# Patient Record
Sex: Female | Born: 1950 | Race: White | Hispanic: No | Smoking: Never smoker
Health system: Southern US, Community
[De-identification: ages and names within clinical notes are randomized; demographics above are authoritative.]

## PROBLEM LIST (undated history)

## (undated) DIAGNOSIS — J45909 Unspecified asthma, uncomplicated: Secondary | ICD-10-CM

## (undated) DIAGNOSIS — E785 Hyperlipidemia, unspecified: Secondary | ICD-10-CM

## (undated) DIAGNOSIS — Z889 Allergy status to unspecified drugs, medicaments and biological substances status: Secondary | ICD-10-CM

## (undated) DIAGNOSIS — C801 Malignant (primary) neoplasm, unspecified: Secondary | ICD-10-CM

## (undated) DIAGNOSIS — M810 Age-related osteoporosis without current pathological fracture: Secondary | ICD-10-CM

## (undated) DIAGNOSIS — M1990 Unspecified osteoarthritis, unspecified site: Secondary | ICD-10-CM

## (undated) HISTORY — PX: TONSILLECTOMY: SUR1361

## (undated) HISTORY — PX: OTHER SURGICAL HISTORY: SHX169

## (undated) HISTORY — PX: ABDOMINAL HYSTERECTOMY: SHX81

## (undated) HISTORY — PX: BREAST EXCISIONAL BIOPSY: SUR124

## (undated) HISTORY — PX: TUBAL LIGATION: SHX77

---

## 1984-03-30 HISTORY — PX: REDUCTION MAMMAPLASTY: SUR839

## 2004-04-02 ENCOUNTER — Ambulatory Visit: Payer: Self-pay

## 2004-04-14 ENCOUNTER — Ambulatory Visit: Payer: Self-pay

## 2005-10-20 ENCOUNTER — Ambulatory Visit: Payer: Self-pay | Admitting: Obstetrics and Gynecology

## 2006-10-26 ENCOUNTER — Ambulatory Visit: Payer: Self-pay | Admitting: Obstetrics and Gynecology

## 2006-12-27 ENCOUNTER — Ambulatory Visit: Payer: Self-pay | Admitting: Gastroenterology

## 2007-11-03 ENCOUNTER — Ambulatory Visit: Payer: Self-pay | Admitting: Obstetrics and Gynecology

## 2008-11-15 ENCOUNTER — Ambulatory Visit: Payer: Self-pay | Admitting: Obstetrics and Gynecology

## 2009-12-17 ENCOUNTER — Ambulatory Visit: Payer: Self-pay | Admitting: Obstetrics and Gynecology

## 2011-03-03 ENCOUNTER — Ambulatory Visit: Payer: Self-pay | Admitting: Obstetrics and Gynecology

## 2011-03-31 HISTORY — PX: BREAST BIOPSY: SHX20

## 2011-07-28 ENCOUNTER — Ambulatory Visit: Payer: Self-pay | Admitting: Obstetrics and Gynecology

## 2012-02-02 ENCOUNTER — Ambulatory Visit: Payer: Self-pay | Admitting: Obstetrics and Gynecology

## 2012-03-10 ENCOUNTER — Ambulatory Visit: Payer: Self-pay | Admitting: Surgery

## 2012-03-10 LAB — CBC WITH DIFFERENTIAL/PLATELET
Eosinophil %: 1.7 %
HGB: 14.6 g/dL (ref 12.0–16.0)
Lymphocyte #: 2 10*3/uL (ref 1.0–3.6)
Lymphocyte %: 30.7 %
MCH: 30.8 pg (ref 26.0–34.0)
MCHC: 34.3 g/dL (ref 32.0–36.0)
MCV: 90 fL (ref 80–100)
Neutrophil %: 58.7 %
Platelet: 238 10*3/uL (ref 150–440)
RDW: 12.6 % (ref 11.5–14.5)

## 2012-03-10 LAB — BASIC METABOLIC PANEL
Anion Gap: 6 — ABNORMAL LOW (ref 7–16)
BUN: 12 mg/dL (ref 7–18)
Calcium, Total: 9.1 mg/dL (ref 8.5–10.1)
Chloride: 106 mmol/L (ref 98–107)
Co2: 29 mmol/L (ref 21–32)
Creatinine: 0.84 mg/dL (ref 0.60–1.30)
EGFR (African American): >60
Osmolality: 281 (ref 275–301)

## 2012-03-15 ENCOUNTER — Ambulatory Visit: Payer: Self-pay | Admitting: Surgery

## 2012-03-16 LAB — PATHOLOGY REPORT

## 2012-06-30 ENCOUNTER — Ambulatory Visit: Payer: Self-pay | Admitting: Surgery

## 2014-06-05 ENCOUNTER — Ambulatory Visit: Payer: Self-pay | Admitting: Obstetrics and Gynecology

## 2014-07-17 NOTE — H&P (Signed)
PATIENT NAME:  Susan Nicholson, Susan Nicholson MR#:  [id number] DATE OF BIRTH:  Oct 15, 1950  DATE OF ADMISSION:  03/15/2012  CHIEF COMPLAINT: Right mammographic abnormality.   HISTORY OF PRESENT ILLNESS: This is a patient who has had multiple aspirations of a breast cyst of the right breast, which is persistent and recurrent. She is here for elective right needle localization breast biopsy. A needle localization wire will be placed in the right breast using ultrasound guidance in the office prior to this procedure in the operating room.   PAST MEDICAL HISTORY: Hyperlipidemia, asthma, history of melanoma, stress incontinence and colon polyps.   MEDICATIONS: Estrogens, Premarin, Fosamax and gemfibrozil.   FAMILY HISTORY: Noncontributory.   SOCIAL HISTORY: The patient does not smoke or drink.   REVIEW OF SYSTEMS: A 10 system review has been performed and documented in the office chart.   PHYSICAL EXAMINATION:  GENERAL: Healthy female patient.  NECK: No palpable neck nodes.  CHEST: Clear to auscultation.  CARDIAC: Regular rate and rhythm.  ABDOMEN: Soft, nontender.  EXTREMITIES: Without edema. Calves are nontender.  NEUROLOGIC: Grossly intact.  INTEGUMENT: No jaundice.  BREASTS: No mass in the left breast. In the right breast, no palpable mass (mammographic and ultrasonographic abnormality is in the medial portion at the 3 o'clock position).   ASSESSMENT AND PLAN: This is a patient with a mammographic abnormality of the right breast. Multiple cyst aspirations have been performed in this area and there is an area of persistent recurrence requiring excisional biopsy. The rationale for this has been discussed. The options of observation or continued aspiration had been reviewed and the risks of bleeding, infection, hematoma, seroma and cosmetic deformity have been reviewed. She and her husband understood and agreed to proceed.    ____________________________ Jerrol Banana Burt Knack, MD rec:aw D: 03/14/2012  04:54:09 ET T: 03/15/2012 08:15:02 ET JOB#: [id number]  cc: Jerrol Banana. Burt Knack, MD, <Dictator> Florene Glen MD ELECTRONICALLY SIGNED 03/15/2012 12:28

## 2014-07-17 NOTE — Op Note (Signed)
PATIENT NAME:  Susan Nicholson, SHALL MR#:  [id number] DATE OF BIRTH:  09-18-1950  DATE OF PROCEDURE:  03/15/2012  PREOPERATIVE DIAGNOSIS: Right mammographic abnormality.   POSTOPERATIVE DIAGNOSIS: Right mammographic abnormality.   PROCEDURE: Right ultrasound-guided needle localization breast biopsy.   ANESTHESIA: General with LMA.   INDICATIONS: This is a patient with an abnormality on her right mammogram and ultrasound. It shows to be a cyst on ultrasound. Multiple aspirations have been performed and it recurs quickly, necessitating definitive biopsy.   Preoperatively, we discussed the rationale for surgery, the options of observation, risks of bleeding, infection, cosmetic deformity including hematoma, seroma and draining wound and concave wound. She understood and agreed to proceed. This was reviewed with both her and her husband in the preop holding area.   Additionally, a needle localization wire was placed under ultrasound guidance in the office prior to coming to the operating room. See office notes for that dictation.   FINDINGS: Probable fibrocystic disease surrounded by scar from prior breast reduction surgery.   DESCRIPTION OF PROCEDURE: The patient was induced to general anesthesia, prepped and draped in a sterile fashion. A curvilinear periareolar incision was made on the right near the entry point of the needle localization wire. Dissection down to the wire was performed, and then it was traced down to the end of the wire. What appeared to be a fibrocystic complex with 1 dominant cyst was identified, and the specimen was elevated and sent off for examination. Hemostasis was with electrocautery. Marcaine was infiltrated in the skin and subcutaneous tissues around the cavity walls for a total of 30 mL. There were no other suspicious lesions palpable or visible on the cavity walls. Once assuring that hemostasis was adequate and sponge, lap and needle count was correct, the wound was closed in  layers with 3-0 Vicryl and 4-0 subcuticular Monocryl. Steri-Strips, Mastisol and sterile dressings were placed.   The patient tolerated the procedure well. There were no complications. She was taken to the recovery room in stable condition, to be discharged in the care of her family. Followup in 10 days.   ____________________________ Jerrol Banana Burt Knack, MD rec:jm D: 03/15/2012 11:40:50 ET T: 03/15/2012 21:15:12 ET JOB#: [id number]  cc: Jerrol Banana. Burt Knack, MD, <Dictator> Florene Glen MD ELECTRONICALLY SIGNED 03/16/2012 16:10

## 2015-06-05 ENCOUNTER — Other Ambulatory Visit: Payer: Self-pay | Admitting: Obstetrics and Gynecology

## 2015-06-05 DIAGNOSIS — Z1231 Encounter for screening mammogram for malignant neoplasm of breast: Secondary | ICD-10-CM

## 2015-06-13 ENCOUNTER — Other Ambulatory Visit: Payer: Self-pay | Admitting: Obstetrics and Gynecology

## 2015-06-13 ENCOUNTER — Ambulatory Visit
Admission: RE | Admit: 2015-06-13 | Discharge: 2015-06-13 | Disposition: A | Discharge status: Home / Self Care | Payer: 59 | Source: Ambulatory Visit | Attending: Obstetrics and Gynecology | Admitting: Obstetrics and Gynecology

## 2015-06-13 DIAGNOSIS — Z1231 Encounter for screening mammogram for malignant neoplasm of breast: Secondary | ICD-10-CM | POA: Diagnosis present

## 2016-06-09 ENCOUNTER — Other Ambulatory Visit: Payer: Self-pay | Admitting: Obstetrics and Gynecology

## 2016-06-09 DIAGNOSIS — Z1231 Encounter for screening mammogram for malignant neoplasm of breast: Secondary | ICD-10-CM

## 2016-07-07 ENCOUNTER — Ambulatory Visit
Admission: RE | Admit: 2016-07-07 | Discharge: 2016-07-07 | Disposition: A | Discharge status: Home / Self Care | Payer: 59 | Source: Ambulatory Visit | Attending: Obstetrics and Gynecology | Admitting: Obstetrics and Gynecology

## 2016-07-07 DIAGNOSIS — Z1231 Encounter for screening mammogram for malignant neoplasm of breast: Secondary | ICD-10-CM | POA: Insufficient documentation

## 2016-10-08 DIAGNOSIS — M65331 Trigger finger, right middle finger: Secondary | ICD-10-CM | POA: Diagnosis not present

## 2016-10-08 DIAGNOSIS — M65351 Trigger finger, right little finger: Secondary | ICD-10-CM | POA: Diagnosis not present

## 2016-10-09 ENCOUNTER — Other Ambulatory Visit: Payer: Self-pay | Admitting: Internal Medicine

## 2016-10-09 DIAGNOSIS — M8589 Other specified disorders of bone density and structure, multiple sites: Secondary | ICD-10-CM | POA: Diagnosis not present

## 2016-10-09 DIAGNOSIS — N393 Stress incontinence (female) (male): Secondary | ICD-10-CM | POA: Diagnosis not present

## 2016-10-09 DIAGNOSIS — Z3201 Encounter for pregnancy test, result positive: Secondary | ICD-10-CM

## 2016-10-09 DIAGNOSIS — Z136 Encounter for screening for cardiovascular disorders: Secondary | ICD-10-CM | POA: Diagnosis not present

## 2016-10-09 DIAGNOSIS — E7800 Pure hypercholesterolemia, unspecified: Secondary | ICD-10-CM | POA: Diagnosis not present

## 2016-10-09 DIAGNOSIS — Z0000 Encounter for general adult medical examination without abnormal findings: Secondary | ICD-10-CM | POA: Diagnosis not present

## 2016-10-09 DIAGNOSIS — Z780 Asymptomatic menopausal state: Secondary | ICD-10-CM | POA: Diagnosis not present

## 2016-10-09 DIAGNOSIS — Z23 Encounter for immunization: Secondary | ICD-10-CM | POA: Diagnosis not present

## 2016-10-15 ENCOUNTER — Ambulatory Visit
Admission: RE | Admit: 2016-10-15 | Discharge: 2016-10-15 | Disposition: A | Discharge status: Home / Self Care | Payer: PPO | Source: Ambulatory Visit | Attending: Internal Medicine | Admitting: Internal Medicine

## 2016-10-15 DIAGNOSIS — E7800 Pure hypercholesterolemia, unspecified: Secondary | ICD-10-CM | POA: Diagnosis not present

## 2016-10-15 DIAGNOSIS — Z136 Encounter for screening for cardiovascular disorders: Secondary | ICD-10-CM | POA: Diagnosis not present

## 2016-10-15 DIAGNOSIS — I714 Abdominal aortic aneurysm, without rupture: Secondary | ICD-10-CM | POA: Diagnosis not present

## 2016-10-15 DIAGNOSIS — Z8249 Family history of ischemic heart disease and other diseases of the circulatory system: Secondary | ICD-10-CM | POA: Diagnosis not present

## 2016-10-15 DIAGNOSIS — Z3201 Encounter for pregnancy test, result positive: Secondary | ICD-10-CM

## 2016-11-09 DIAGNOSIS — M65331 Trigger finger, right middle finger: Secondary | ICD-10-CM | POA: Diagnosis not present

## 2016-11-09 DIAGNOSIS — M65351 Trigger finger, right little finger: Secondary | ICD-10-CM | POA: Diagnosis not present

## 2016-12-18 DIAGNOSIS — L03116 Cellulitis of left lower limb: Secondary | ICD-10-CM | POA: Diagnosis not present

## 2017-01-06 DIAGNOSIS — R399 Unspecified symptoms and signs involving the genitourinary system: Secondary | ICD-10-CM | POA: Diagnosis not present

## 2017-01-06 DIAGNOSIS — N390 Urinary tract infection, site not specified: Secondary | ICD-10-CM | POA: Diagnosis not present

## 2017-05-07 DIAGNOSIS — K219 Gastro-esophageal reflux disease without esophagitis: Secondary | ICD-10-CM | POA: Diagnosis not present

## 2017-05-07 DIAGNOSIS — Z1211 Encounter for screening for malignant neoplasm of colon: Secondary | ICD-10-CM | POA: Diagnosis not present

## 2017-06-14 ENCOUNTER — Other Ambulatory Visit: Payer: Self-pay | Admitting: Internal Medicine

## 2017-06-14 DIAGNOSIS — Z1231 Encounter for screening mammogram for malignant neoplasm of breast: Secondary | ICD-10-CM

## 2017-06-15 DIAGNOSIS — Z1272 Encounter for screening for malignant neoplasm of vagina: Secondary | ICD-10-CM | POA: Diagnosis not present

## 2017-06-15 DIAGNOSIS — Z124 Encounter for screening for malignant neoplasm of cervix: Secondary | ICD-10-CM | POA: Diagnosis not present

## 2017-06-15 DIAGNOSIS — Z01419 Encounter for gynecological examination (general) (routine) without abnormal findings: Secondary | ICD-10-CM | POA: Diagnosis not present

## 2017-07-12 ENCOUNTER — Ambulatory Visit: Admission: RE | Admit: 2017-07-12 | Payer: PPO | Source: Ambulatory Visit | Admitting: Gastroenterology

## 2017-07-12 ENCOUNTER — Encounter: Admission: RE | Payer: Self-pay | Source: Ambulatory Visit

## 2017-07-12 HISTORY — DX: Allergy status to unspecified drugs, medicaments and biological substances: Z88.9

## 2017-07-12 HISTORY — DX: Age-related osteoporosis without current pathological fracture: M81.0

## 2017-07-12 HISTORY — DX: Malignant (primary) neoplasm, unspecified: C80.1

## 2017-07-12 HISTORY — DX: Unspecified asthma, uncomplicated: J45.909

## 2017-07-12 HISTORY — DX: Hyperlipidemia, unspecified: E78.5

## 2017-07-12 HISTORY — DX: Unspecified osteoarthritis, unspecified site: M19.90

## 2017-07-12 SURGERY — COLONOSCOPY WITH PROPOFOL
Anesthesia: General

## 2017-07-14 ENCOUNTER — Ambulatory Visit
Admission: RE | Admit: 2017-07-14 | Discharge: 2017-07-14 | Disposition: A | Discharge status: Home / Self Care | Payer: PPO | Source: Ambulatory Visit | Attending: Internal Medicine | Admitting: Internal Medicine

## 2017-07-14 DIAGNOSIS — Z1231 Encounter for screening mammogram for malignant neoplasm of breast: Secondary | ICD-10-CM

## 2017-09-17 ENCOUNTER — Ambulatory Visit: Payer: PPO | Admitting: Certified Registered Nurse Anesthetist

## 2017-09-17 ENCOUNTER — Encounter
Admission: RE | Disposition: A | Discharge status: Home / Self Care | Payer: Self-pay | Source: Ambulatory Visit | Attending: Gastroenterology

## 2017-09-17 ENCOUNTER — Ambulatory Visit
Admission: RE | Admit: 2017-09-17 | Discharge: 2017-09-17 | Disposition: A | Discharge status: Home / Self Care | Payer: PPO | Source: Ambulatory Visit | Attending: Gastroenterology | Admitting: Gastroenterology

## 2017-09-17 ENCOUNTER — Encounter: Payer: Self-pay | Admitting: *Deleted

## 2017-09-17 DIAGNOSIS — K5790 Diverticulosis of intestine, part unspecified, without perforation or abscess without bleeding: Secondary | ICD-10-CM | POA: Diagnosis not present

## 2017-09-17 DIAGNOSIS — K5730 Diverticulosis of large intestine without perforation or abscess without bleeding: Secondary | ICD-10-CM | POA: Insufficient documentation

## 2017-09-17 DIAGNOSIS — Z85828 Personal history of other malignant neoplasm of skin: Secondary | ICD-10-CM | POA: Diagnosis not present

## 2017-09-17 DIAGNOSIS — J45909 Unspecified asthma, uncomplicated: Secondary | ICD-10-CM | POA: Diagnosis not present

## 2017-09-17 DIAGNOSIS — D123 Benign neoplasm of transverse colon: Secondary | ICD-10-CM | POA: Insufficient documentation

## 2017-09-17 DIAGNOSIS — M1990 Unspecified osteoarthritis, unspecified site: Secondary | ICD-10-CM | POA: Diagnosis not present

## 2017-09-17 DIAGNOSIS — Z1211 Encounter for screening for malignant neoplasm of colon: Secondary | ICD-10-CM | POA: Diagnosis not present

## 2017-09-17 DIAGNOSIS — Z79899 Other long term (current) drug therapy: Secondary | ICD-10-CM | POA: Diagnosis not present

## 2017-09-17 DIAGNOSIS — M810 Age-related osteoporosis without current pathological fracture: Secondary | ICD-10-CM | POA: Insufficient documentation

## 2017-09-17 DIAGNOSIS — Z7982 Long term (current) use of aspirin: Secondary | ICD-10-CM | POA: Diagnosis not present

## 2017-09-17 DIAGNOSIS — Z7951 Long term (current) use of inhaled steroids: Secondary | ICD-10-CM | POA: Insufficient documentation

## 2017-09-17 DIAGNOSIS — E785 Hyperlipidemia, unspecified: Secondary | ICD-10-CM | POA: Diagnosis not present

## 2017-09-17 DIAGNOSIS — K635 Polyp of colon: Secondary | ICD-10-CM | POA: Diagnosis not present

## 2017-09-17 HISTORY — PX: COLONOSCOPY WITH PROPOFOL: SHX5780

## 2017-09-17 SURGERY — COLONOSCOPY WITH PROPOFOL
Anesthesia: General

## 2017-09-17 MED ORDER — SODIUM CHLORIDE 0.9 % IV SOLN: INTRAVENOUS | Status: DC

## 2017-09-17 MED ORDER — PROPOFOL 10 MG/ML IV BOLUS
INTRAVENOUS | Status: DC | PRN
  Administered 2017-09-17: 10 mg via INTRAVENOUS
  Administered 2017-09-17: 60 mg via INTRAVENOUS

## 2017-09-17 MED ORDER — PROPOFOL 500 MG/50ML IV EMUL
INTRAVENOUS | Status: AC
  Filled 2017-09-17: qty 50

## 2017-09-17 MED ORDER — SODIUM CHLORIDE 0.9 % IV SOLN
INTRAVENOUS | Status: DC
  Administered 2017-09-17: 1000 mL via INTRAVENOUS

## 2017-09-17 MED ORDER — PROPOFOL 500 MG/50ML IV EMUL
INTRAVENOUS | Status: DC | PRN
  Administered 2017-09-17: 160 ug/kg/min via INTRAVENOUS

## 2017-09-17 NOTE — H&P (Signed)
Outpatient short stay form Pre-procedure 09/17/2017 10:39 AM Lollie Sails MD  Primary Physician: Glendon Axe, MD  Reason for visit: Colonoscopy  History of present illness: Patient is a 67 year old female presenting today for colonoscopy.  Her last was over 10 years ago with finding of 2 small hyperplastic polyps.  Patient takes a 81 mg aspirin about 3 times a week.  She takes no other aspirin products or blood thinning agents.  She tolerated her prep well.    Current Facility-Administered Medications:  .  0.9 %  sodium chloride infusion, , Intravenous, Continuous, Lollie Sails, MD .  0.9 %  sodium chloride infusion, , Intravenous, Continuous, Lollie Sails, MD, Last Rate: 20 mL/hr at 09/17/17 1019, 1,000 mL at 09/17/17 1019  Medications Prior to Admission  Medication Sig Dispense Refill Last Dose  . conjugated estrogens (PREMARIN) vaginal cream Place 1 Applicatorful vaginally daily.   09/16/2017 at Unknown time  . darifenacin (ENABLEX) 7.5 MG 24 hr tablet Take 7.5 mg by mouth daily.   Past Week at Unknown time  . Multiple Vitamin (MULTIVITAMIN) tablet Take 1 tablet by mouth daily.   Past Week at Unknown time  . raloxifene (EVISTA) 60 MG tablet Take 60 mg by mouth daily.   09/16/2017 at Unknown time  . simvastatin (ZOCOR) 20 MG tablet Take 20 mg by mouth daily.   09/16/2017 at Unknown time  . albuterol (PROVENTIL HFA;VENTOLIN HFA) 108 (90 Base) MCG/ACT inhaler Inhale into the lungs every 6 (six) hours as needed for wheezing or shortness of breath.   Not Taking at Unknown time     Not on File   Past Medical History:  Diagnosis Date  . Arthritis   . Asthma   . Cancer ([place])    basal cell   . History of seasonal allergies   . Hyperlipidemia   . Osteoporosis     Review of systems:      Physical Exam    Heart and lungs: Without rub or gallop, lungs are bilaterally clear.    HEENT: Normocephalic atraumatic eyes are anicteric    Other:    Pertinant exam for  procedure: Soft nontender nondistended bowel sounds positive normoactive    Planned proceedures: Anoscopy and indicated per I have discussed the risks benefits and complications of procedures to include not limited to bleeding, infection, perforation and the risk of sedation and the patient wishes to proceed.    Lollie Sails, MD Gastroenterology 09/17/2017  10:39 AM

## 2017-09-17 NOTE — Anesthesia Postprocedure Evaluation (Signed)
Anesthesia Post Note  Patient: Susan Nicholson  Procedure(s) Performed: COLONOSCOPY WITH PROPOFOL (N/A )  Patient location during evaluation: Endoscopy Anesthesia Type: General Level of consciousness: awake and alert Pain management: pain level controlled Vital Signs Assessment: post-procedure vital signs reviewed and stable Respiratory status: spontaneous breathing, nonlabored ventilation, respiratory function stable and patient connected to nasal cannula oxygen Cardiovascular status: blood pressure returned to baseline and stable Postop Assessment: no apparent nausea or vomiting Anesthetic complications: no     Last Vitals:  Vitals:   09/17/17 1155 09/17/17 1205  BP: (!) 98/59 117/66  Pulse:    Resp:    Temp: (!) 36.1 C   SpO2:      Last Pain:  Vitals:   09/17/17 1215  TempSrc:   PainSc: 0-No pain                 Martha Clan

## 2017-09-17 NOTE — Anesthesia Preprocedure Evaluation (Signed)
Anesthesia Evaluation  Patient identified by MRN, date of birth, ID band Patient awake    Reviewed: Allergy & Precautions, H&P , NPO status , Patient's Chart, lab work & pertinent test results, reviewed documented beta blocker date and time   History of Anesthesia Complications Negative for: history of anesthetic complications  Airway Mallampati: I  TM Distance: >3 FB Neck ROM: full    Dental  (+) Dental Advidsory Given, Caps, Teeth Intact   Pulmonary neg shortness of breath, asthma (many years ago) , neg COPD, neg recent URI,           Cardiovascular Exercise Tolerance: Good negative cardio ROS       Neuro/Psych negative neurological ROS  negative psych ROS   GI/Hepatic negative GI ROS, Neg liver ROS,   Endo/Other  negative endocrine ROS  Renal/GU negative Renal ROS  negative genitourinary   Musculoskeletal   Abdominal   Peds  Hematology negative hematology ROS (+)   Anesthesia Other Findings Past Medical History: No date: Arthritis No date: Asthma No date: Cancer ([place])     Comment:  basal cell  No date: History of seasonal allergies No date: Hyperlipidemia No date: Osteoporosis   Reproductive/Obstetrics negative OB ROS                             Anesthesia Physical Anesthesia Plan  ASA: II  Anesthesia Plan: General   Post-op Pain Management:    Induction: Intravenous  PONV Risk Score and Plan: 3 and Propofol infusion  Airway Management Planned: Nasal Cannula  Additional Equipment:   Intra-op Plan:   Post-operative Plan:   Informed Consent: I have reviewed the patients History and Physical, chart, labs and discussed the procedure including the risks, benefits and alternatives for the proposed anesthesia with the patient or authorized representative who has indicated his/her understanding and acceptance.   Dental Advisory Given  Plan Discussed with:  Anesthesiologist, CRNA and Surgeon  Anesthesia Plan Comments:         Anesthesia Quick Evaluation

## 2017-09-17 NOTE — Anesthesia Post-op Follow-up Note (Signed)
Anesthesia QCDR form completed.        

## 2017-09-17 NOTE — Transfer of Care (Signed)
Immediate Anesthesia Transfer of Care Note  Patient: Susan Nicholson  Procedure(s) Performed: COLONOSCOPY WITH PROPOFOL (N/A )  Patient Location: PACU  Anesthesia Type:General  Level of Consciousness: sedated  Airway & Oxygen Therapy: Patient Spontanous Breathing and Patient connected to nasal cannula oxygen  Post-op Assessment: Report given to RN and Post -op Vital signs reviewed and stable  Post vital signs: Reviewed and stable  Last Vitals:  Vitals Value Taken Time  BP 98/59 09/17/2017 11:56 AM  Temp    Pulse 68 09/17/2017 11:56 AM  Resp 13 09/17/2017 11:56 AM  SpO2 99 % 09/17/2017 11:56 AM  Vitals shown include unvalidated device data.  Last Pain:  Vitals:   09/17/17 0952  TempSrc: Tympanic  PainSc: 0-No pain         Complications: No apparent anesthesia complications

## 2017-09-17 NOTE — Op Note (Signed)
[facility] Gastroenterology Patient Name: Susan Nicholson Procedure Date: 09/17/2017 11:17 AM MRN: [id number] Account #: [id number] Date of Birth: [birth year] Admit Type: Outpatient Age: 67 Room: [facility] ENDO ROOM 3 Gender: Female Note Status: Finalized Procedure:            Colonoscopy Indications:          Screening for colorectal malignant neoplasm Providers:            Lollie Sails, MD Referring MD:         Glendon Axe (Referring MD) Medicines:            Monitored Anesthesia Care Complications:        No immediate complications. Procedure:            Pre-Anesthesia Assessment:                       - ASA Grade Assessment: II - A patient with mild                        systemic disease.                       After obtaining informed consent, the colonoscope was                        passed under direct vision. Throughout the procedure,                        the patient's blood pressure, pulse, and oxygen                        saturations were monitored continuously. The                        Colonoscope was introduced through the anus and                        advanced to the the cecum, identified by appendiceal                        orifice and ileocecal valve. The colonoscopy was                        performed with moderate difficulty due to significant                        looping. Successful completion of the procedure was                        aided by changing the patient to a supine position,                        changing the patient to a prone position and using                        manual pressure. The patient tolerated the procedure                        well. The quality of the bowel preparation was good. Findings:      A 2 mm polyp was found in  the transverse colon. The polyp was sessile.       The polyp was removed with a cold biopsy forceps. Resection and       retrieval were complete.      A few small-mouthed diverticula  were found in the sigmoid colon and       descending colon.      The retroflexed view of the distal rectum and anal verge was normal and       showed no anal or rectal abnormalities.      The digital rectal exam was normal. Impression:           - One 2 mm polyp in the transverse colon, removed with                        a cold biopsy forceps. Resected and retrieved.                       - Diverticulosis in the sigmoid colon and in the                        descending colon.                       - The distal rectum and anal verge are normal on                        retroflexion view. Recommendation:       - Discharge patient to home.                       - Await pathology results.                       - Telephone GI clinic for pathology results in 1 week. Procedure Code(s):    --- Professional ---                       [phone], Colonoscopy, flexible; with biopsy, single or                        multiple Diagnosis Code(s):    --- Professional ---                       Z12.11, Encounter for screening for malignant neoplasm                        of colon                       D12.3, Benign neoplasm of transverse colon (hepatic                        flexure or splenic flexure)                       K57.30, Diverticulosis of large intestine without                        perforation or abscess without bleeding CPT copyright 2017 American Medical Association. All rights reserved. The codes documented in this report are preliminary and upon coder review may  be revised to meet current compliance requirements. [name]  [name], MD 09/17/2017 11:54:51 AM This report has been signed electronically. Number of Addenda: 0 Note Initiated On: 09/17/2017 11:17 AM Scope Withdrawal Time: 0 hours 9 minutes 16 seconds  Total Procedure Duration: 0 hours 29 minutes 54 seconds       [facility]

## 2017-09-17 NOTE — Anesthesia Procedure Notes (Signed)
Performed by: Demetrius Charity, CRNA Pre-anesthesia Checklist: Patient identified, Patient being monitored, Timeout performed, Emergency Drugs available and Suction available Patient Re-evaluated:Patient Re-evaluated prior to induction Oxygen Delivery Method: Nasal cannula Preoxygenation: Pre-oxygenation with 100% oxygen Dental Injury: Teeth and Oropharynx as per pre-operative assessment

## 2017-09-19 NOTE — Progress Notes (Signed)
Non-identifying voicemail.  No message left.

## 2017-09-20 LAB — SURGICAL PATHOLOGY

## 2017-10-29 DIAGNOSIS — Z1329 Encounter for screening for other suspected endocrine disorder: Secondary | ICD-10-CM | POA: Diagnosis not present

## 2017-10-29 DIAGNOSIS — Z131 Encounter for screening for diabetes mellitus: Secondary | ICD-10-CM | POA: Diagnosis not present

## 2017-10-29 DIAGNOSIS — Z13220 Encounter for screening for lipoid disorders: Secondary | ICD-10-CM | POA: Diagnosis not present

## 2017-10-29 DIAGNOSIS — Z0000 Encounter for general adult medical examination without abnormal findings: Secondary | ICD-10-CM | POA: Diagnosis not present

## 2017-10-29 DIAGNOSIS — I10 Essential (primary) hypertension: Secondary | ICD-10-CM | POA: Diagnosis not present

## 2017-11-10 DIAGNOSIS — Z23 Encounter for immunization: Secondary | ICD-10-CM | POA: Diagnosis not present

## 2017-11-10 DIAGNOSIS — Z0000 Encounter for general adult medical examination without abnormal findings: Secondary | ICD-10-CM | POA: Diagnosis not present

## 2017-11-10 DIAGNOSIS — E7800 Pure hypercholesterolemia, unspecified: Secondary | ICD-10-CM | POA: Diagnosis not present

## 2017-11-10 DIAGNOSIS — M8589 Other specified disorders of bone density and structure, multiple sites: Secondary | ICD-10-CM | POA: Diagnosis not present

## 2017-11-10 DIAGNOSIS — I10 Essential (primary) hypertension: Secondary | ICD-10-CM | POA: Diagnosis not present

## 2017-11-12 DIAGNOSIS — I83892 Varicose veins of left lower extremities with other complications: Secondary | ICD-10-CM | POA: Diagnosis not present

## 2017-11-12 DIAGNOSIS — I8311 Varicose veins of right lower extremity with inflammation: Secondary | ICD-10-CM | POA: Diagnosis not present

## 2017-11-12 DIAGNOSIS — I8312 Varicose veins of left lower extremity with inflammation: Secondary | ICD-10-CM | POA: Diagnosis not present

## 2017-11-12 DIAGNOSIS — I83812 Varicose veins of left lower extremities with pain: Secondary | ICD-10-CM | POA: Diagnosis not present

## 2017-12-14 DIAGNOSIS — M8588 Other specified disorders of bone density and structure, other site: Secondary | ICD-10-CM | POA: Diagnosis not present

## 2017-12-24 DIAGNOSIS — I8312 Varicose veins of left lower extremity with inflammation: Secondary | ICD-10-CM | POA: Diagnosis not present

## 2017-12-24 DIAGNOSIS — I83812 Varicose veins of left lower extremities with pain: Secondary | ICD-10-CM | POA: Diagnosis not present

## 2017-12-24 DIAGNOSIS — I83892 Varicose veins of left lower extremities with other complications: Secondary | ICD-10-CM | POA: Diagnosis not present

## 2018-02-02 DIAGNOSIS — I83892 Varicose veins of left lower extremities with other complications: Secondary | ICD-10-CM | POA: Diagnosis not present

## 2018-02-02 DIAGNOSIS — I8312 Varicose veins of left lower extremity with inflammation: Secondary | ICD-10-CM | POA: Diagnosis not present

## 2018-02-04 DIAGNOSIS — I8312 Varicose veins of left lower extremity with inflammation: Secondary | ICD-10-CM | POA: Diagnosis not present

## 2018-02-04 DIAGNOSIS — I83892 Varicose veins of left lower extremities with other complications: Secondary | ICD-10-CM | POA: Diagnosis not present

## 2018-02-04 DIAGNOSIS — I83812 Varicose veins of left lower extremities with pain: Secondary | ICD-10-CM | POA: Diagnosis not present

## 2018-02-17 DIAGNOSIS — I83812 Varicose veins of left lower extremities with pain: Secondary | ICD-10-CM | POA: Diagnosis not present

## 2018-02-17 DIAGNOSIS — I8312 Varicose veins of left lower extremity with inflammation: Secondary | ICD-10-CM | POA: Diagnosis not present

## 2018-03-03 DIAGNOSIS — I8312 Varicose veins of left lower extremity with inflammation: Secondary | ICD-10-CM | POA: Diagnosis not present

## 2018-03-03 DIAGNOSIS — I83812 Varicose veins of left lower extremities with pain: Secondary | ICD-10-CM | POA: Diagnosis not present

## 2018-04-06 DIAGNOSIS — I83812 Varicose veins of left lower extremities with pain: Secondary | ICD-10-CM | POA: Diagnosis not present

## 2018-04-06 DIAGNOSIS — M7981 Nontraumatic hematoma of soft tissue: Secondary | ICD-10-CM | POA: Diagnosis not present

## 2018-04-06 DIAGNOSIS — I8312 Varicose veins of left lower extremity with inflammation: Secondary | ICD-10-CM | POA: Diagnosis not present

## 2018-06-09 ENCOUNTER — Other Ambulatory Visit: Payer: Self-pay | Admitting: Obstetrics and Gynecology

## 2018-06-09 DIAGNOSIS — Z1231 Encounter for screening mammogram for malignant neoplasm of breast: Secondary | ICD-10-CM

## 2018-07-27 ENCOUNTER — Ambulatory Visit: Payer: PPO

## 2018-08-23 DIAGNOSIS — Z124 Encounter for screening for malignant neoplasm of cervix: Secondary | ICD-10-CM | POA: Diagnosis not present

## 2018-08-23 DIAGNOSIS — E7800 Pure hypercholesterolemia, unspecified: Secondary | ICD-10-CM | POA: Diagnosis not present

## 2018-08-23 DIAGNOSIS — Z780 Asymptomatic menopausal state: Secondary | ICD-10-CM | POA: Diagnosis not present

## 2018-08-23 DIAGNOSIS — M8589 Other specified disorders of bone density and structure, multiple sites: Secondary | ICD-10-CM | POA: Diagnosis not present

## 2018-09-05 ENCOUNTER — Ambulatory Visit: Payer: PPO

## 2018-09-06 DIAGNOSIS — I83812 Varicose veins of left lower extremities with pain: Secondary | ICD-10-CM | POA: Diagnosis not present

## 2018-09-06 DIAGNOSIS — I8312 Varicose veins of left lower extremity with inflammation: Secondary | ICD-10-CM | POA: Diagnosis not present

## 2018-09-13 ENCOUNTER — Other Ambulatory Visit: Payer: Self-pay

## 2018-09-13 ENCOUNTER — Encounter (INDEPENDENT_AMBULATORY_CARE_PROVIDER_SITE_OTHER): Payer: Self-pay

## 2018-09-13 ENCOUNTER — Ambulatory Visit
Admission: RE | Admit: 2018-09-13 | Discharge: 2018-09-13 | Disposition: A | Discharge status: Home / Self Care | Payer: PPO | Source: Ambulatory Visit | Attending: Obstetrics and Gynecology | Admitting: Obstetrics and Gynecology

## 2018-09-13 DIAGNOSIS — Z1231 Encounter for screening mammogram for malignant neoplasm of breast: Secondary | ICD-10-CM | POA: Diagnosis not present

## 2018-12-30 DIAGNOSIS — Z79899 Other long term (current) drug therapy: Secondary | ICD-10-CM | POA: Diagnosis not present

## 2018-12-30 DIAGNOSIS — Z0000 Encounter for general adult medical examination without abnormal findings: Secondary | ICD-10-CM | POA: Diagnosis not present

## 2018-12-30 DIAGNOSIS — Z1331 Encounter for screening for depression: Secondary | ICD-10-CM | POA: Diagnosis not present

## 2018-12-30 DIAGNOSIS — E7800 Pure hypercholesterolemia, unspecified: Secondary | ICD-10-CM | POA: Diagnosis not present

## 2019-03-16 DIAGNOSIS — H2513 Age-related nuclear cataract, bilateral: Secondary | ICD-10-CM | POA: Diagnosis not present

## 2019-03-16 DIAGNOSIS — H35372 Puckering of macula, left eye: Secondary | ICD-10-CM | POA: Diagnosis not present

## 2019-03-16 DIAGNOSIS — H5203 Hypermetropia, bilateral: Secondary | ICD-10-CM | POA: Diagnosis not present

## 2019-04-26 DIAGNOSIS — H2512 Age-related nuclear cataract, left eye: Secondary | ICD-10-CM | POA: Diagnosis not present

## 2019-04-26 DIAGNOSIS — H25013 Cortical age-related cataract, bilateral: Secondary | ICD-10-CM | POA: Diagnosis not present

## 2019-04-26 DIAGNOSIS — H40013 Open angle with borderline findings, low risk, bilateral: Secondary | ICD-10-CM | POA: Diagnosis not present

## 2019-04-26 DIAGNOSIS — H2513 Age-related nuclear cataract, bilateral: Secondary | ICD-10-CM | POA: Diagnosis not present

## 2019-04-26 DIAGNOSIS — H35373 Puckering of macula, bilateral: Secondary | ICD-10-CM | POA: Diagnosis not present

## 2019-04-26 DIAGNOSIS — H35033 Hypertensive retinopathy, bilateral: Secondary | ICD-10-CM | POA: Diagnosis not present

## 2019-05-09 DIAGNOSIS — H25812 Combined forms of age-related cataract, left eye: Secondary | ICD-10-CM | POA: Diagnosis not present

## 2019-05-09 DIAGNOSIS — H25013 Cortical age-related cataract, bilateral: Secondary | ICD-10-CM | POA: Diagnosis not present

## 2019-05-09 DIAGNOSIS — H2513 Age-related nuclear cataract, bilateral: Secondary | ICD-10-CM | POA: Diagnosis not present

## 2019-05-16 DIAGNOSIS — H2512 Age-related nuclear cataract, left eye: Secondary | ICD-10-CM | POA: Diagnosis not present

## 2019-05-31 DIAGNOSIS — H2511 Age-related nuclear cataract, right eye: Secondary | ICD-10-CM | POA: Diagnosis not present

## 2019-05-31 DIAGNOSIS — H25011 Cortical age-related cataract, right eye: Secondary | ICD-10-CM | POA: Diagnosis not present

## 2019-06-06 DIAGNOSIS — H2511 Age-related nuclear cataract, right eye: Secondary | ICD-10-CM | POA: Diagnosis not present

## 2019-06-06 DIAGNOSIS — H25011 Cortical age-related cataract, right eye: Secondary | ICD-10-CM | POA: Diagnosis not present

## 2019-06-06 DIAGNOSIS — H25811 Combined forms of age-related cataract, right eye: Secondary | ICD-10-CM | POA: Diagnosis not present

## 2019-06-13 DIAGNOSIS — H2511 Age-related nuclear cataract, right eye: Secondary | ICD-10-CM | POA: Diagnosis not present

## 2019-08-22 DIAGNOSIS — L578 Other skin changes due to chronic exposure to nonionizing radiation: Secondary | ICD-10-CM | POA: Diagnosis not present

## 2019-08-22 DIAGNOSIS — L570 Actinic keratosis: Secondary | ICD-10-CM | POA: Diagnosis not present

## 2019-08-22 DIAGNOSIS — L720 Epidermal cyst: Secondary | ICD-10-CM | POA: Diagnosis not present

## 2019-08-22 DIAGNOSIS — Z85828 Personal history of other malignant neoplasm of skin: Secondary | ICD-10-CM | POA: Diagnosis not present

## 2019-08-24 DIAGNOSIS — Z9189 Other specified personal risk factors, not elsewhere classified: Secondary | ICD-10-CM | POA: Diagnosis not present

## 2019-08-24 DIAGNOSIS — Z1272 Encounter for screening for malignant neoplasm of vagina: Secondary | ICD-10-CM | POA: Diagnosis not present

## 2019-08-24 DIAGNOSIS — M542 Cervicalgia: Secondary | ICD-10-CM | POA: Diagnosis not present

## 2019-08-31 ENCOUNTER — Other Ambulatory Visit: Payer: Self-pay | Admitting: Obstetrics and Gynecology

## 2019-08-31 DIAGNOSIS — Z1231 Encounter for screening mammogram for malignant neoplasm of breast: Secondary | ICD-10-CM

## 2019-09-05 ENCOUNTER — Other Ambulatory Visit: Payer: Self-pay | Admitting: Family Medicine

## 2019-09-05 DIAGNOSIS — M542 Cervicalgia: Secondary | ICD-10-CM | POA: Diagnosis not present

## 2019-09-05 DIAGNOSIS — E7800 Pure hypercholesterolemia, unspecified: Secondary | ICD-10-CM

## 2019-09-12 ENCOUNTER — Other Ambulatory Visit: Payer: Self-pay

## 2019-09-12 ENCOUNTER — Ambulatory Visit
Admission: RE | Admit: 2019-09-12 | Discharge: 2019-09-12 | Disposition: A | Discharge status: Home / Self Care | Payer: PPO | Source: Ambulatory Visit | Attending: Family Medicine | Admitting: Family Medicine

## 2019-09-12 DIAGNOSIS — M542 Cervicalgia: Secondary | ICD-10-CM | POA: Insufficient documentation

## 2019-09-12 DIAGNOSIS — E7800 Pure hypercholesterolemia, unspecified: Secondary | ICD-10-CM | POA: Insufficient documentation

## 2019-09-12 DIAGNOSIS — I6523 Occlusion and stenosis of bilateral carotid arteries: Secondary | ICD-10-CM | POA: Diagnosis not present

## 2019-09-14 ENCOUNTER — Ambulatory Visit
Admission: RE | Admit: 2019-09-14 | Discharge: 2019-09-14 | Disposition: A | Discharge status: Home / Self Care | Payer: PPO | Source: Ambulatory Visit | Attending: Obstetrics and Gynecology | Admitting: Obstetrics and Gynecology

## 2019-09-14 DIAGNOSIS — Z1231 Encounter for screening mammogram for malignant neoplasm of breast: Secondary | ICD-10-CM | POA: Diagnosis not present

## 2019-11-13 DIAGNOSIS — H40013 Open angle with borderline findings, low risk, bilateral: Secondary | ICD-10-CM | POA: Diagnosis not present

## 2019-11-13 DIAGNOSIS — H35372 Puckering of macula, left eye: Secondary | ICD-10-CM | POA: Diagnosis not present

## 2020-01-01 DIAGNOSIS — Z0000 Encounter for general adult medical examination without abnormal findings: Secondary | ICD-10-CM | POA: Diagnosis not present

## 2020-01-03 DIAGNOSIS — E7800 Pure hypercholesterolemia, unspecified: Secondary | ICD-10-CM | POA: Diagnosis not present

## 2020-01-03 DIAGNOSIS — Z79899 Other long term (current) drug therapy: Secondary | ICD-10-CM | POA: Diagnosis not present

## 2020-04-10 DIAGNOSIS — L570 Actinic keratosis: Secondary | ICD-10-CM | POA: Diagnosis not present

## 2020-04-10 DIAGNOSIS — I781 Nevus, non-neoplastic: Secondary | ICD-10-CM | POA: Diagnosis not present

## 2020-08-05 DIAGNOSIS — N3000 Acute cystitis without hematuria: Secondary | ICD-10-CM | POA: Diagnosis not present

## 2020-08-30 DIAGNOSIS — H35372 Puckering of macula, left eye: Secondary | ICD-10-CM | POA: Diagnosis not present

## 2020-08-30 DIAGNOSIS — H40019 Open angle with borderline findings, low risk, unspecified eye: Secondary | ICD-10-CM | POA: Diagnosis not present

## 2020-09-02 DIAGNOSIS — Z872 Personal history of diseases of the skin and subcutaneous tissue: Secondary | ICD-10-CM | POA: Diagnosis not present

## 2020-09-02 DIAGNOSIS — Z85828 Personal history of other malignant neoplasm of skin: Secondary | ICD-10-CM | POA: Diagnosis not present

## 2020-09-02 DIAGNOSIS — L578 Other skin changes due to chronic exposure to nonionizing radiation: Secondary | ICD-10-CM | POA: Diagnosis not present

## 2020-09-03 ENCOUNTER — Other Ambulatory Visit: Payer: Self-pay | Admitting: Obstetrics and Gynecology

## 2020-09-03 DIAGNOSIS — Z124 Encounter for screening for malignant neoplasm of cervix: Secondary | ICD-10-CM | POA: Diagnosis not present

## 2020-09-03 DIAGNOSIS — Z1231 Encounter for screening mammogram for malignant neoplasm of breast: Secondary | ICD-10-CM | POA: Diagnosis not present

## 2020-09-03 DIAGNOSIS — Z9189 Other specified personal risk factors, not elsewhere classified: Secondary | ICD-10-CM | POA: Diagnosis not present

## 2020-09-03 DIAGNOSIS — Z01419 Encounter for gynecological examination (general) (routine) without abnormal findings: Secondary | ICD-10-CM | POA: Diagnosis not present

## 2020-09-03 DIAGNOSIS — Z1272 Encounter for screening for malignant neoplasm of vagina: Secondary | ICD-10-CM | POA: Diagnosis not present

## 2020-09-03 DIAGNOSIS — M85859 Other specified disorders of bone density and structure, unspecified thigh: Secondary | ICD-10-CM | POA: Diagnosis not present

## 2020-09-03 DIAGNOSIS — M8580 Other specified disorders of bone density and structure, unspecified site: Secondary | ICD-10-CM | POA: Diagnosis not present

## 2020-09-26 ENCOUNTER — Other Ambulatory Visit: Payer: Self-pay

## 2020-09-26 ENCOUNTER — Ambulatory Visit
Admission: RE | Admit: 2020-09-26 | Discharge: 2020-09-26 | Disposition: A | Discharge status: Home / Self Care | Payer: PPO | Source: Ambulatory Visit | Attending: Obstetrics and Gynecology | Admitting: Obstetrics and Gynecology

## 2020-09-26 DIAGNOSIS — Z1231 Encounter for screening mammogram for malignant neoplasm of breast: Secondary | ICD-10-CM | POA: Diagnosis not present

## 2020-09-26 DIAGNOSIS — M810 Age-related osteoporosis without current pathological fracture: Secondary | ICD-10-CM | POA: Diagnosis not present

## 2020-11-12 DIAGNOSIS — M810 Age-related osteoporosis without current pathological fracture: Secondary | ICD-10-CM | POA: Diagnosis not present

## 2020-11-19 DIAGNOSIS — R059 Cough, unspecified: Secondary | ICD-10-CM | POA: Diagnosis not present

## 2020-11-19 DIAGNOSIS — J189 Pneumonia, unspecified organism: Secondary | ICD-10-CM | POA: Diagnosis not present

## 2020-12-26 DIAGNOSIS — E7800 Pure hypercholesterolemia, unspecified: Secondary | ICD-10-CM | POA: Diagnosis not present

## 2020-12-26 DIAGNOSIS — Z79899 Other long term (current) drug therapy: Secondary | ICD-10-CM | POA: Diagnosis not present

## 2021-01-02 DIAGNOSIS — Z1331 Encounter for screening for depression: Secondary | ICD-10-CM | POA: Diagnosis not present

## 2021-01-02 DIAGNOSIS — Z23 Encounter for immunization: Secondary | ICD-10-CM | POA: Diagnosis not present

## 2021-01-02 DIAGNOSIS — Z0000 Encounter for general adult medical examination without abnormal findings: Secondary | ICD-10-CM | POA: Diagnosis not present

## 2021-05-19 DIAGNOSIS — H35372 Puckering of macula, left eye: Secondary | ICD-10-CM | POA: Diagnosis not present

## 2021-05-19 DIAGNOSIS — H40013 Open angle with borderline findings, low risk, bilateral: Secondary | ICD-10-CM | POA: Diagnosis not present

## 2021-06-12 IMAGING — US US CAROTID DUPLEX BILAT
1 series · 13 of 24 positions shown · non-contrast
Comparison: None.

CLINICAL DATA: 60-year-old female with a history of right neck pain
and hypercholesterolemia

EXAM:
BILATERAL CAROTID DUPLEX ULTRASOUND
TECHNIQUE: Gray scale imaging, color Doppler and duplex ultrasound were
performed of bilateral carotid and vertebral arteries in the neck.

[Series 1: us carotid duplex bilat · 13 of 62 slices shown]
[im 1/62]
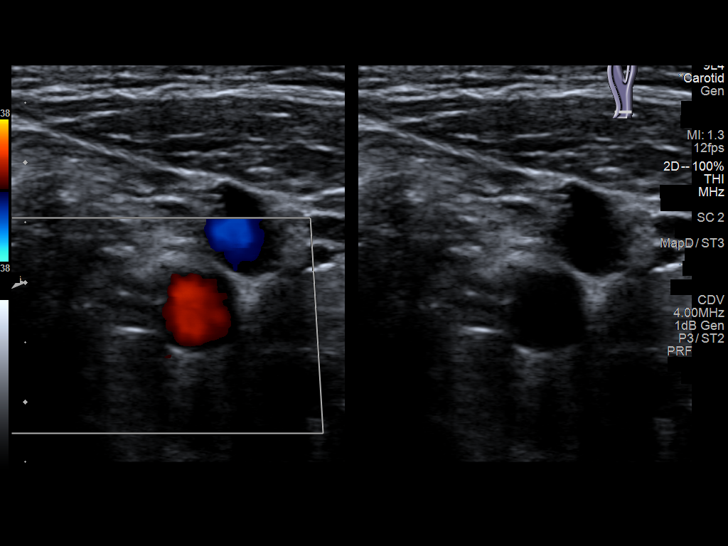
[im 6/62]
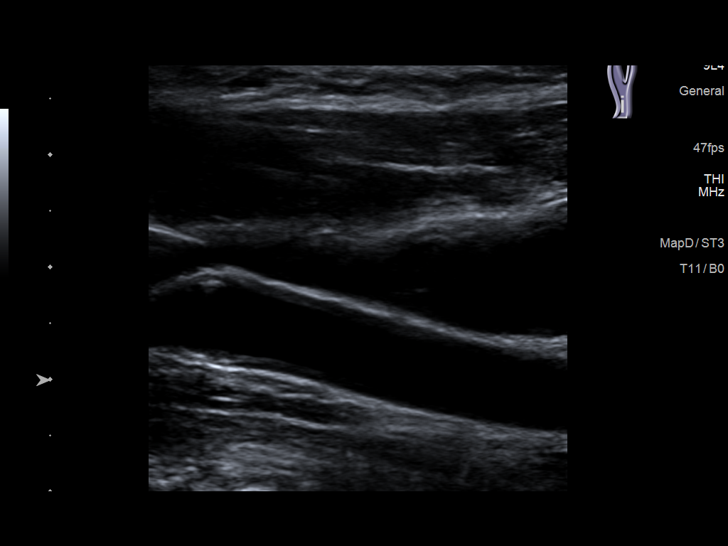
[im 11/62]
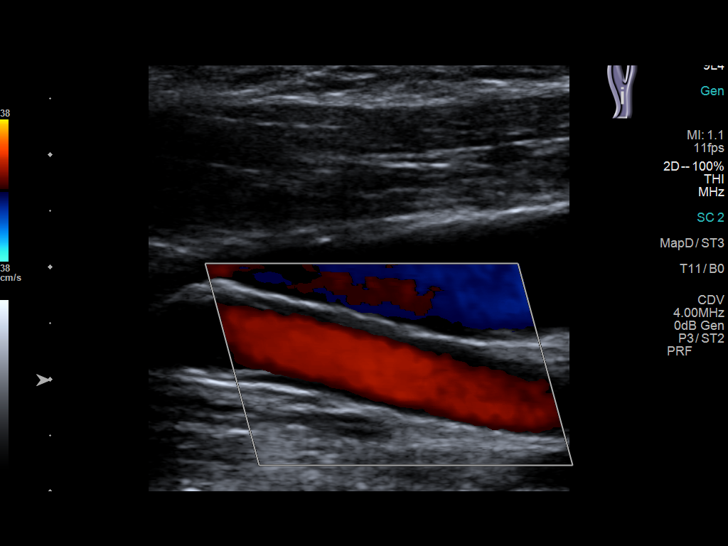
[im 16/62]
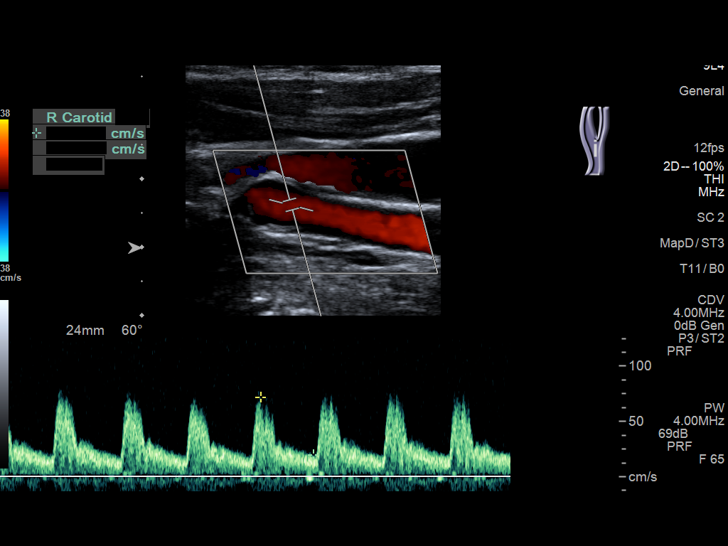
[im 22/62]
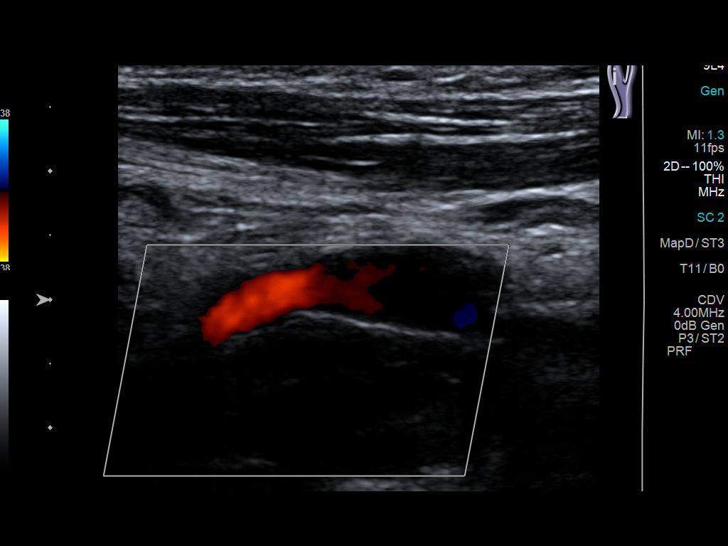
[im 27/62]
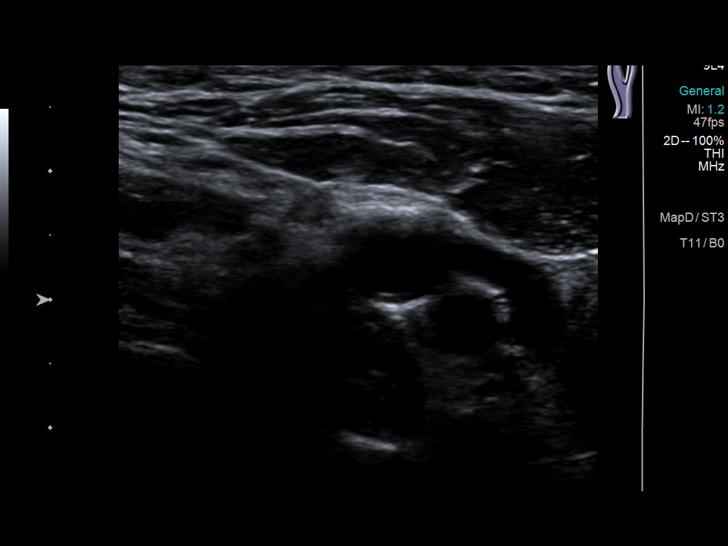
[im 32/62]
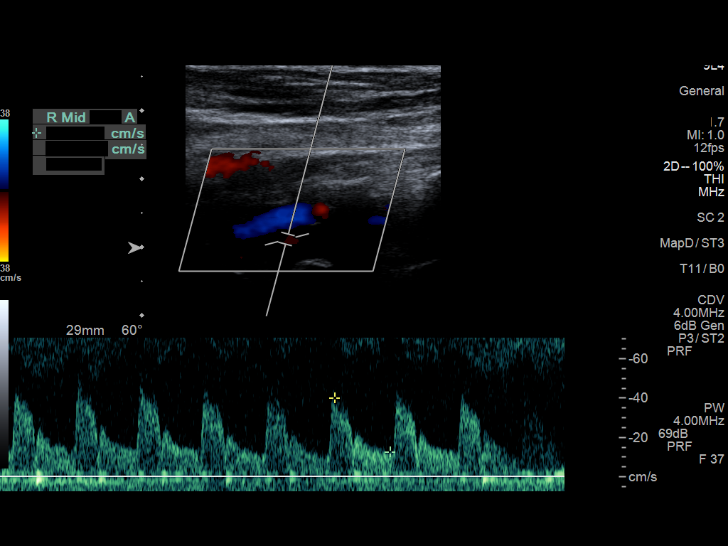
[im 35/62]
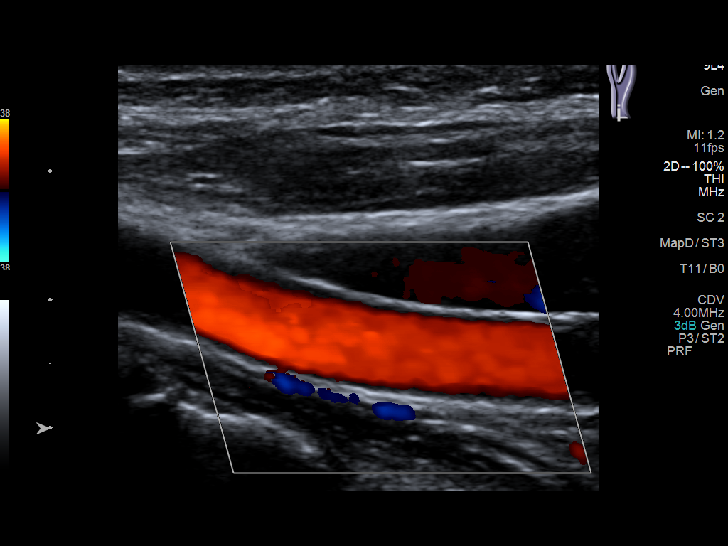
[im 40/62]
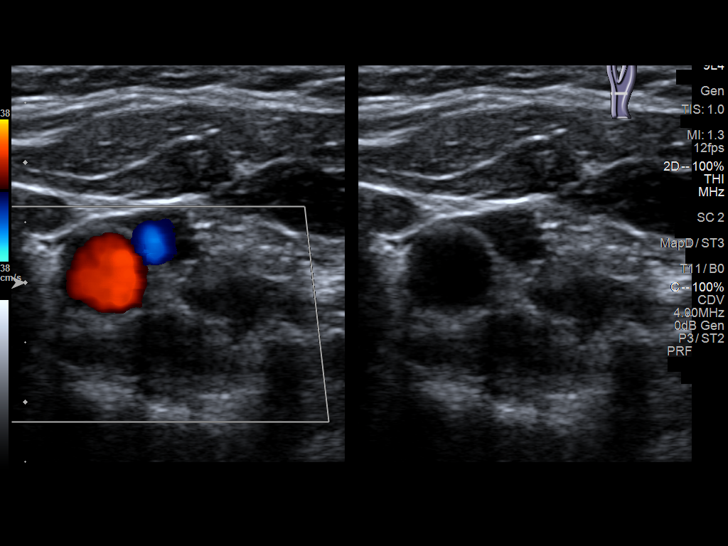
[im 46/62]
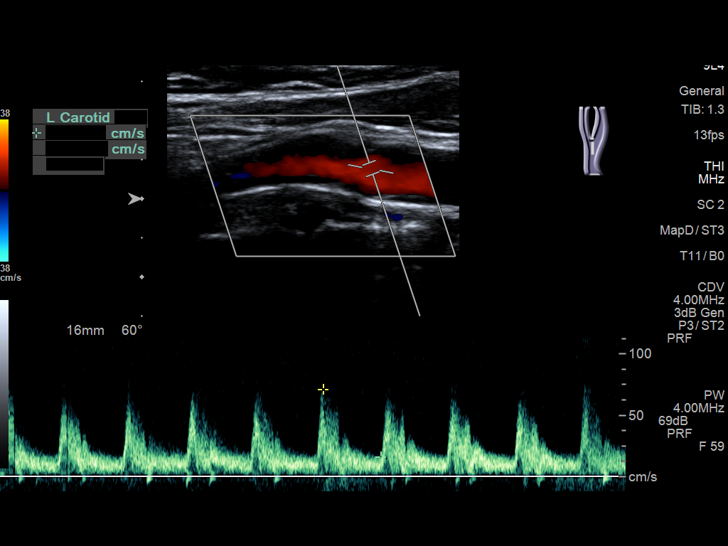
[im 51/62]
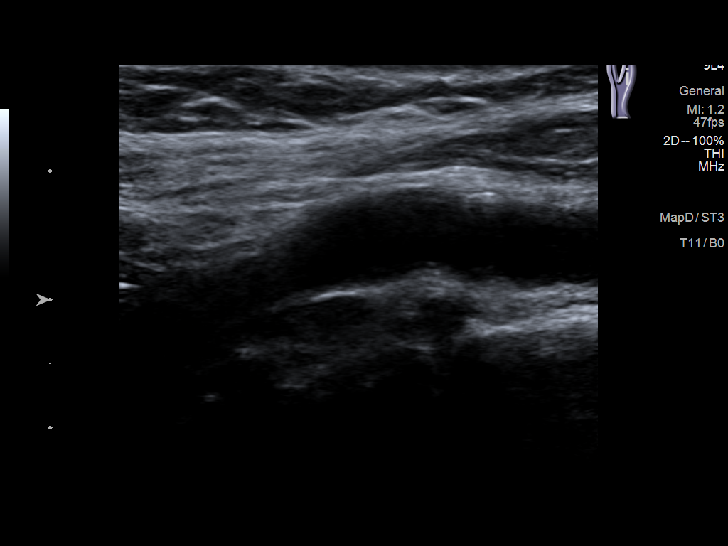
[im 56/62]
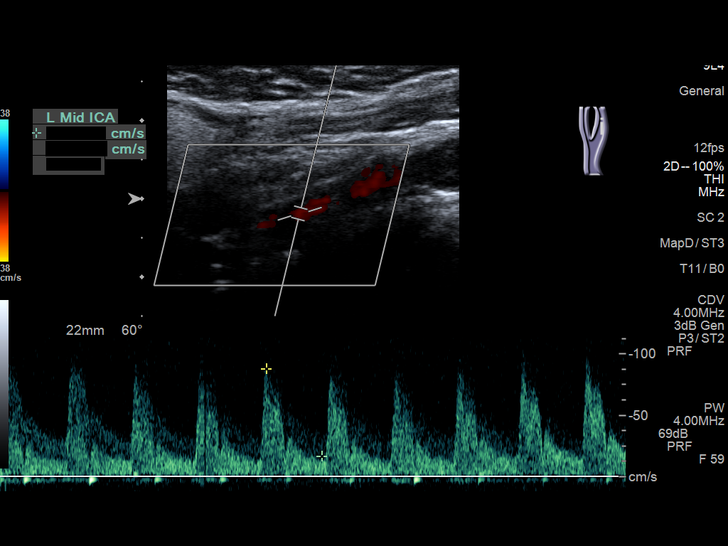
[im 62/62]
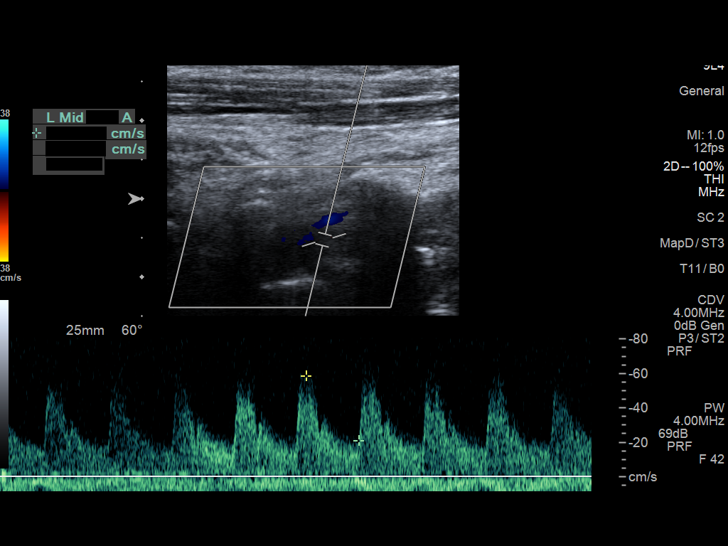

[13 of 24 positions shown; findings below may reference images not displayed]

FINDINGS: Criteria: Quantification of carotid stenosis is based on velocity
parameters that correlate the residual internal carotid diameter
with NASCET-based stenosis levels, using the diameter of the distal
internal carotid lumen as the denominator for stenosis measurement.

The following velocity measurements were obtained:

RIGHT

ICA:  Systolic 134 cm/sec, Diastolic 49 cm/sec

CCA:  97 cm/sec

SYSTOLIC ICA/CCA RATIO:

ECA:  82 cm/sec

LEFT

ICA:  Systolic 134 cm/sec, Diastolic 50 cm/sec

CCA:  103 cm/sec

SYSTOLIC ICA/CCA RATIO:

ECA:  98 cm/sec

Right Brachial SBP: Not acquired

Left Brachial SBP: Not acquired

RIGHT CAROTID ARTERY: No significant calcified disease of the right
common carotid artery. Intermediate waveform maintained.
Heterogeneous plaque without significant calcifications at the right
carotid bifurcation. Low resistance waveform of the right ICA. No
significant tortuosity.

RIGHT VERTEBRAL ARTERY: Antegrade flow with low resistance waveform.

LEFT CAROTID ARTERY: No significant calcified disease of the left
common carotid artery. Intermediate waveform maintained.
Heterogeneous plaque at the left carotid bifurcation without
significant calcifications. Low resistance waveform of the left ICA.

LEFT VERTEBRAL ARTERY:  Antegrade flow with low resistance waveform.
IMPRESSION: Color duplex indicates minimal heterogeneous plaque, with no
hemodynamically significant stenosis by duplex criteria in the
extracranial cerebrovascular circulation.

## 2021-06-14 IMAGING — MG DIGITAL SCREENING BILAT W/ TOMO W/ CAD
8 series · 8 of 24 positions shown · non-contrast
Comparison: Previous exam(s).

CLINICAL DATA: Screening.

EXAM:
DIGITAL SCREENING BILATERAL MAMMOGRAM WITH TOMO AND CAD

[L CC synth-2D]
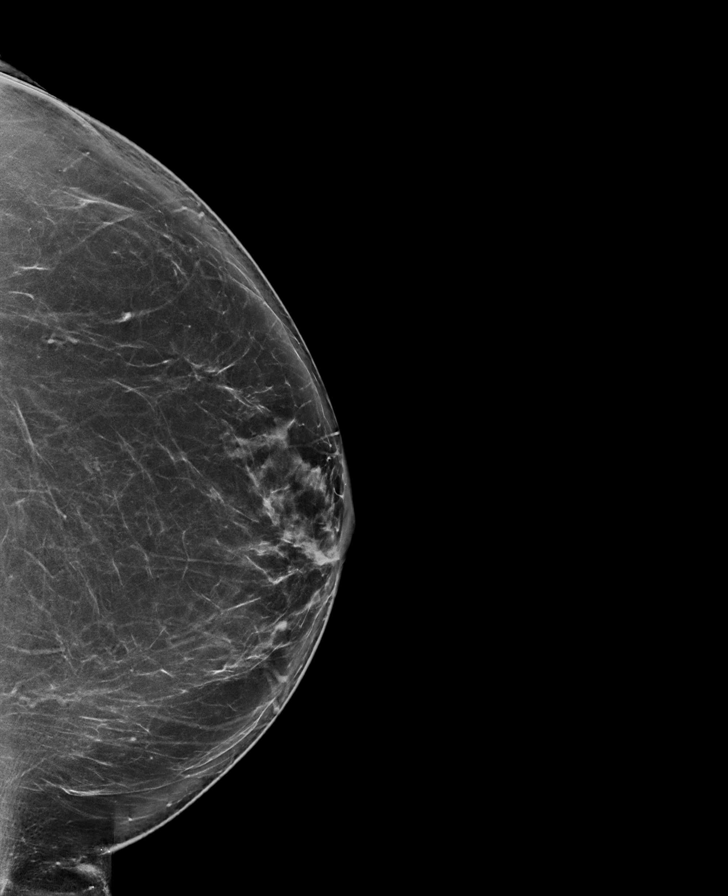

[R MLO synth-2D]
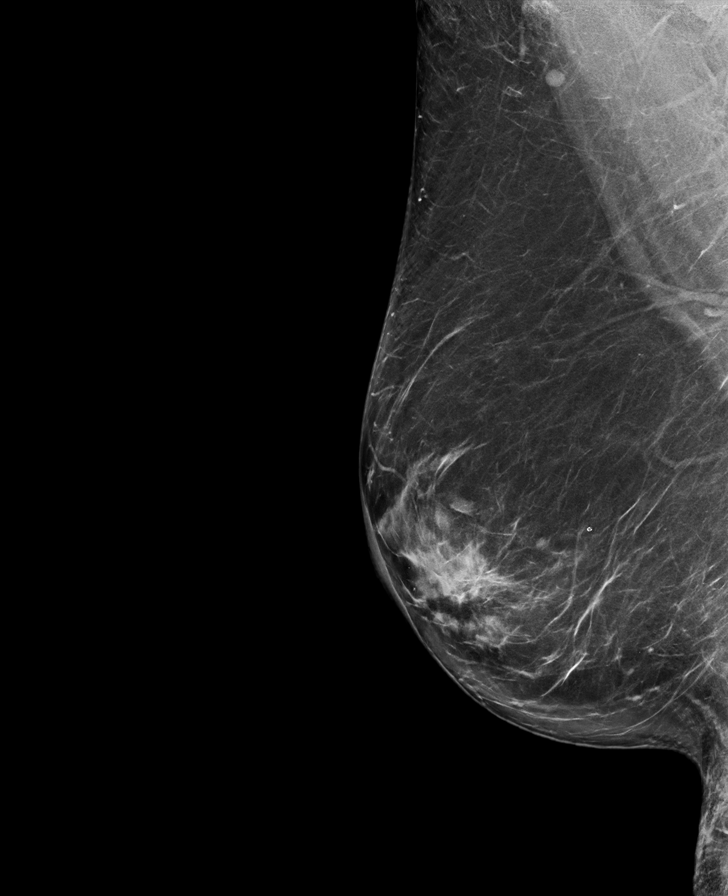

[R CC synth-2D]
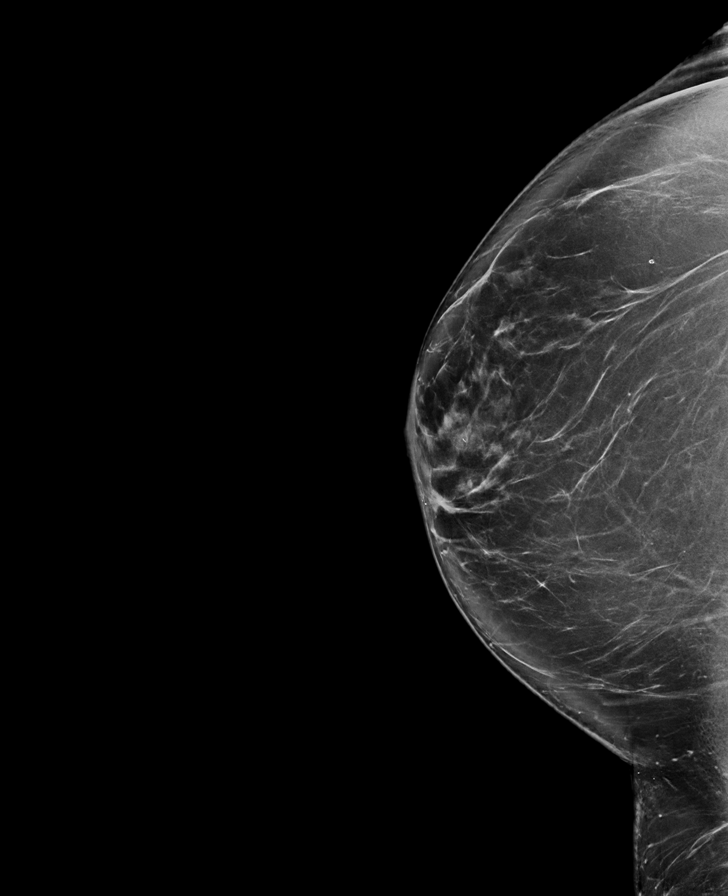

[L MLO synth-2D]
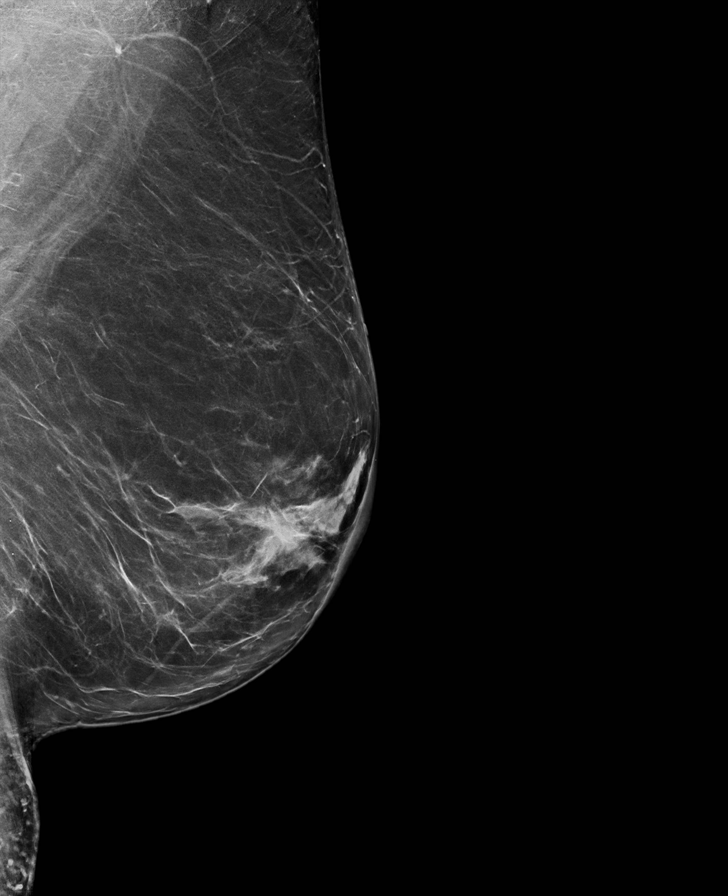

[R CC tomo · tomo slice 49/96.0]
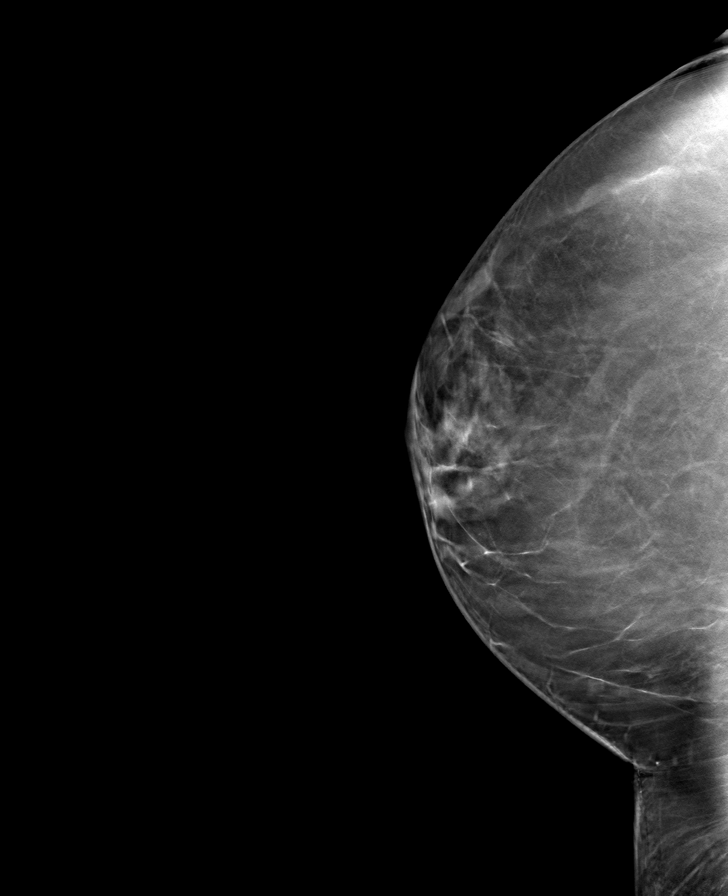

[R MLO tomo · tomo slice 44/87.0]
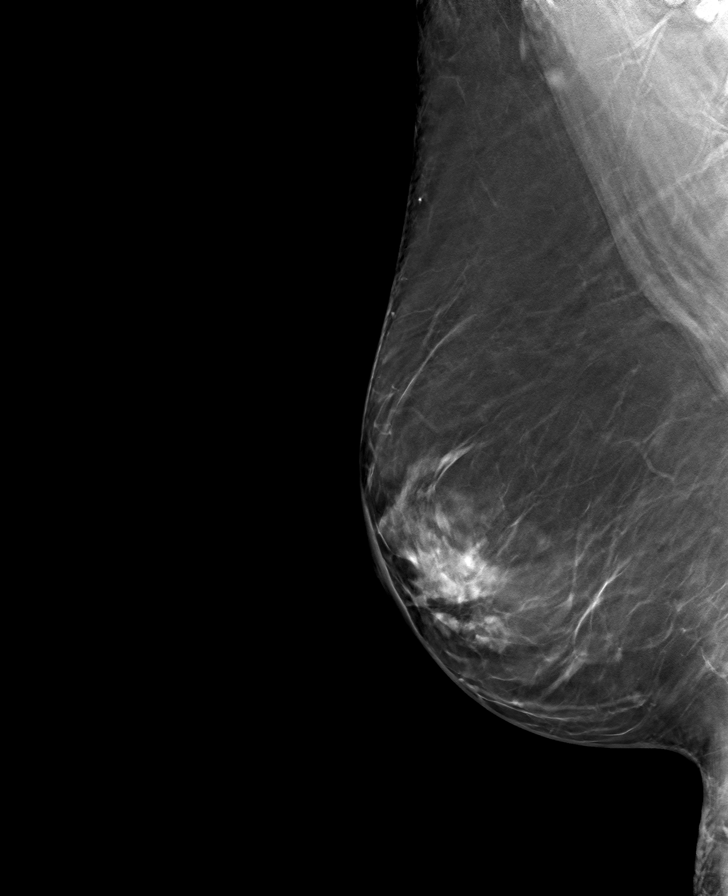

[L MLO tomo · tomo slice 45/90.0]
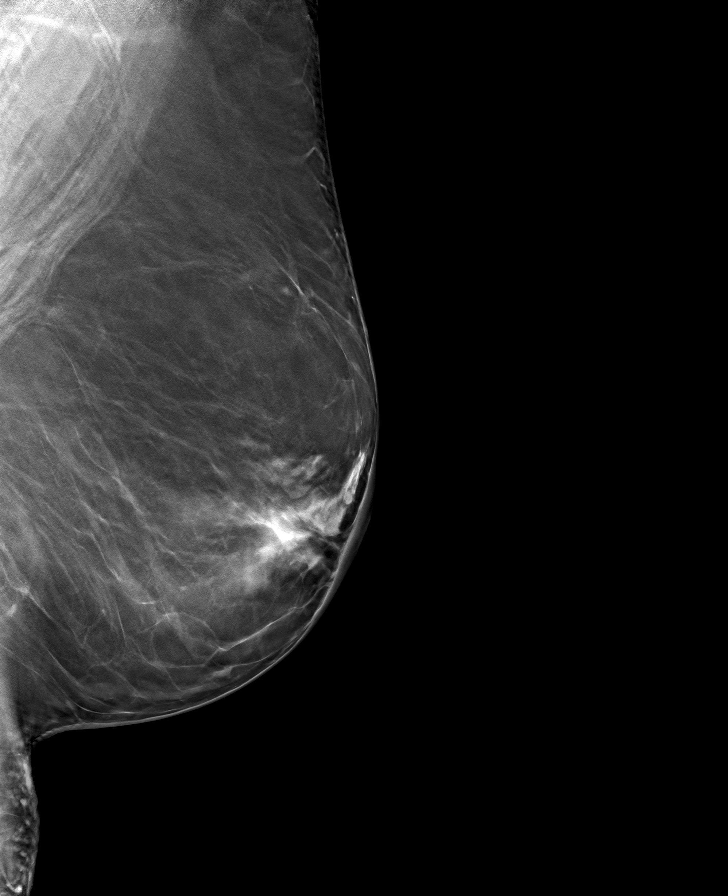

[L CC tomo · tomo slice 47/93.0]
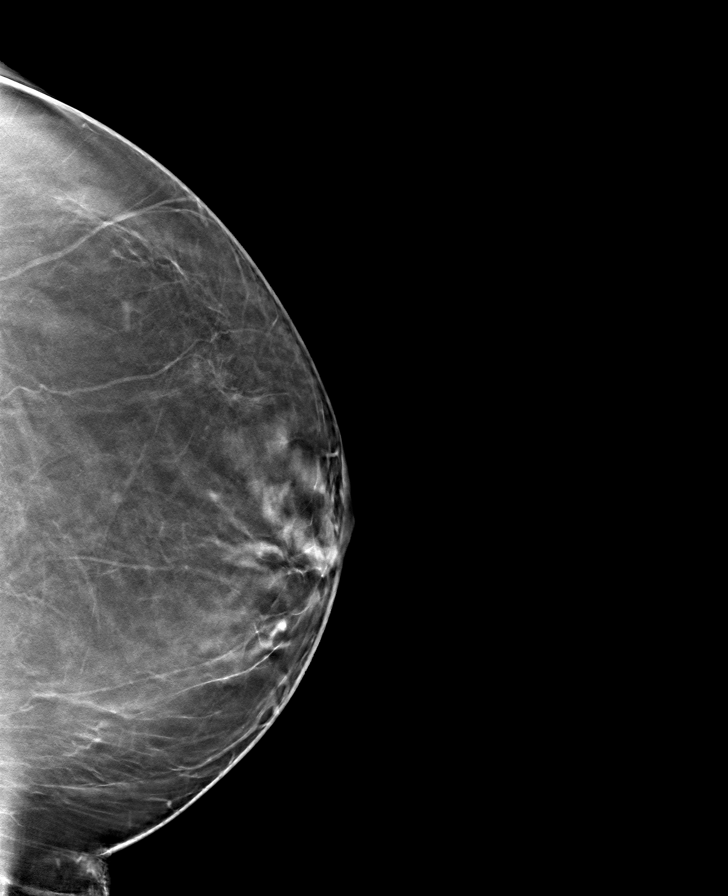

[8 of 24 positions shown; findings below may reference images not displayed]

ACR Breast Density Category b: There are scattered areas of
fibroglandular density.
FINDINGS: There are no findings suspicious for malignancy. Images were
processed with CAD.
IMPRESSION: No mammographic evidence of malignancy. A result letter of this
screening mammogram will be mailed directly to the patient.

RECOMMENDATION:
Screening mammogram in one year. (Code:CN-U-775)

BI-RADS CATEGORY  1: Negative.

## 2021-07-26 DIAGNOSIS — U071 COVID-19: Secondary | ICD-10-CM | POA: Diagnosis not present

## 2021-07-26 DIAGNOSIS — N390 Urinary tract infection, site not specified: Secondary | ICD-10-CM | POA: Diagnosis not present

## 2021-09-02 DIAGNOSIS — L578 Other skin changes due to chronic exposure to nonionizing radiation: Secondary | ICD-10-CM | POA: Diagnosis not present

## 2021-09-02 DIAGNOSIS — Z85828 Personal history of other malignant neoplasm of skin: Secondary | ICD-10-CM | POA: Diagnosis not present

## 2021-09-23 DIAGNOSIS — R102 Pelvic and perineal pain: Secondary | ICD-10-CM | POA: Diagnosis not present

## 2021-09-23 DIAGNOSIS — Z124 Encounter for screening for malignant neoplasm of cervix: Secondary | ICD-10-CM | POA: Diagnosis not present

## 2021-09-23 DIAGNOSIS — Z1331 Encounter for screening for depression: Secondary | ICD-10-CM | POA: Diagnosis not present

## 2021-10-06 ENCOUNTER — Other Ambulatory Visit: Payer: Self-pay | Admitting: Obstetrics and Gynecology

## 2021-10-06 DIAGNOSIS — Z1231 Encounter for screening mammogram for malignant neoplasm of breast: Secondary | ICD-10-CM

## 2021-11-04 ENCOUNTER — Ambulatory Visit
Admission: RE | Admit: 2021-11-04 | Discharge: 2021-11-04 | Disposition: A | Discharge status: Home / Self Care | Payer: PPO | Source: Ambulatory Visit | Attending: Obstetrics and Gynecology | Admitting: Obstetrics and Gynecology

## 2021-11-04 DIAGNOSIS — Z1231 Encounter for screening mammogram for malignant neoplasm of breast: Secondary | ICD-10-CM | POA: Diagnosis not present

## 2022-01-01 DIAGNOSIS — Z79899 Other long term (current) drug therapy: Secondary | ICD-10-CM | POA: Diagnosis not present

## 2022-01-01 DIAGNOSIS — E7800 Pure hypercholesterolemia, unspecified: Secondary | ICD-10-CM | POA: Diagnosis not present

## 2022-01-02 DIAGNOSIS — R300 Dysuria: Secondary | ICD-10-CM | POA: Diagnosis not present

## 2022-01-02 DIAGNOSIS — N390 Urinary tract infection, site not specified: Secondary | ICD-10-CM | POA: Diagnosis not present

## 2022-01-06 DIAGNOSIS — Z1331 Encounter for screening for depression: Secondary | ICD-10-CM | POA: Diagnosis not present

## 2022-01-06 DIAGNOSIS — Z23 Encounter for immunization: Secondary | ICD-10-CM | POA: Diagnosis not present

## 2022-01-06 DIAGNOSIS — Z0000 Encounter for general adult medical examination without abnormal findings: Secondary | ICD-10-CM | POA: Diagnosis not present

## 2022-04-06 DIAGNOSIS — H40013 Open angle with borderline findings, low risk, bilateral: Secondary | ICD-10-CM | POA: Diagnosis not present

## 2022-04-06 DIAGNOSIS — H35372 Puckering of macula, left eye: Secondary | ICD-10-CM | POA: Diagnosis not present

## 2022-05-29 DIAGNOSIS — M1711 Unilateral primary osteoarthritis, right knee: Secondary | ICD-10-CM | POA: Diagnosis not present

## 2022-07-06 DIAGNOSIS — R030 Elevated blood-pressure reading, without diagnosis of hypertension: Secondary | ICD-10-CM | POA: Diagnosis not present

## 2022-07-06 DIAGNOSIS — R8290 Unspecified abnormal findings in urine: Secondary | ICD-10-CM | POA: Diagnosis not present

## 2022-07-06 DIAGNOSIS — N390 Urinary tract infection, site not specified: Secondary | ICD-10-CM | POA: Diagnosis not present

## 2022-07-14 DIAGNOSIS — N952 Postmenopausal atrophic vaginitis: Secondary | ICD-10-CM | POA: Diagnosis not present

## 2022-07-14 DIAGNOSIS — N760 Acute vaginitis: Secondary | ICD-10-CM | POA: Diagnosis not present

## 2022-09-29 ENCOUNTER — Other Ambulatory Visit: Payer: Self-pay | Admitting: Obstetrics and Gynecology

## 2022-09-29 DIAGNOSIS — Z1272 Encounter for screening for malignant neoplasm of vagina: Secondary | ICD-10-CM | POA: Diagnosis not present

## 2022-09-29 DIAGNOSIS — Z1331 Encounter for screening for depression: Secondary | ICD-10-CM | POA: Diagnosis not present

## 2022-09-29 DIAGNOSIS — Z9189 Other specified personal risk factors, not elsewhere classified: Secondary | ICD-10-CM | POA: Diagnosis not present

## 2022-09-29 DIAGNOSIS — M816 Localized osteoporosis [Lequesne]: Secondary | ICD-10-CM | POA: Diagnosis not present

## 2022-09-29 DIAGNOSIS — M8589 Other specified disorders of bone density and structure, multiple sites: Secondary | ICD-10-CM | POA: Diagnosis not present

## 2022-09-29 DIAGNOSIS — Z1231 Encounter for screening mammogram for malignant neoplasm of breast: Secondary | ICD-10-CM

## 2022-09-29 DIAGNOSIS — Z01411 Encounter for gynecological examination (general) (routine) with abnormal findings: Secondary | ICD-10-CM | POA: Diagnosis not present

## 2022-09-29 DIAGNOSIS — E7800 Pure hypercholesterolemia, unspecified: Secondary | ICD-10-CM | POA: Diagnosis not present

## 2022-10-13 DIAGNOSIS — M810 Age-related osteoporosis without current pathological fracture: Secondary | ICD-10-CM | POA: Diagnosis not present

## 2022-10-29 DIAGNOSIS — R300 Dysuria: Secondary | ICD-10-CM | POA: Diagnosis not present

## 2022-10-29 DIAGNOSIS — N390 Urinary tract infection, site not specified: Secondary | ICD-10-CM | POA: Diagnosis not present

## 2022-10-29 DIAGNOSIS — N952 Postmenopausal atrophic vaginitis: Secondary | ICD-10-CM | POA: Diagnosis not present

## 2022-11-19 ENCOUNTER — Ambulatory Visit
Admission: RE | Admit: 2022-11-19 | Discharge: 2022-11-19 | Disposition: A | Discharge status: Home / Self Care | Payer: PPO | Source: Ambulatory Visit | Attending: Obstetrics and Gynecology | Admitting: Obstetrics and Gynecology

## 2022-11-19 DIAGNOSIS — Z1231 Encounter for screening mammogram for malignant neoplasm of breast: Secondary | ICD-10-CM | POA: Diagnosis not present

## 2022-12-01 DIAGNOSIS — D492 Neoplasm of unspecified behavior of bone, soft tissue, and skin: Secondary | ICD-10-CM | POA: Diagnosis not present

## 2022-12-01 DIAGNOSIS — L570 Actinic keratosis: Secondary | ICD-10-CM | POA: Diagnosis not present

## 2022-12-01 DIAGNOSIS — Z85828 Personal history of other malignant neoplasm of skin: Secondary | ICD-10-CM | POA: Diagnosis not present

## 2022-12-01 DIAGNOSIS — L578 Other skin changes due to chronic exposure to nonionizing radiation: Secondary | ICD-10-CM | POA: Diagnosis not present

## 2022-12-01 DIAGNOSIS — Z872 Personal history of diseases of the skin and subcutaneous tissue: Secondary | ICD-10-CM | POA: Diagnosis not present

## 2022-12-08 DIAGNOSIS — M7551 Bursitis of right shoulder: Secondary | ICD-10-CM | POA: Diagnosis not present

## 2022-12-08 DIAGNOSIS — M7541 Impingement syndrome of right shoulder: Secondary | ICD-10-CM | POA: Diagnosis not present

## 2022-12-08 DIAGNOSIS — G8929 Other chronic pain: Secondary | ICD-10-CM | POA: Diagnosis not present

## 2022-12-08 DIAGNOSIS — M778 Other enthesopathies, not elsewhere classified: Secondary | ICD-10-CM | POA: Diagnosis not present

## 2022-12-08 DIAGNOSIS — M67911 Unspecified disorder of synovium and tendon, right shoulder: Secondary | ICD-10-CM | POA: Diagnosis not present

## 2022-12-08 DIAGNOSIS — M19011 Primary osteoarthritis, right shoulder: Secondary | ICD-10-CM | POA: Diagnosis not present

## 2023-01-07 DIAGNOSIS — Z79899 Other long term (current) drug therapy: Secondary | ICD-10-CM | POA: Diagnosis not present

## 2023-01-07 DIAGNOSIS — E7800 Pure hypercholesterolemia, unspecified: Secondary | ICD-10-CM | POA: Diagnosis not present

## 2023-01-18 DIAGNOSIS — Z23 Encounter for immunization: Secondary | ICD-10-CM | POA: Diagnosis not present

## 2023-01-18 DIAGNOSIS — E7800 Pure hypercholesterolemia, unspecified: Secondary | ICD-10-CM | POA: Diagnosis not present

## 2023-01-18 DIAGNOSIS — Z0000 Encounter for general adult medical examination without abnormal findings: Secondary | ICD-10-CM | POA: Diagnosis not present

## 2023-01-18 DIAGNOSIS — Z79899 Other long term (current) drug therapy: Secondary | ICD-10-CM | POA: Diagnosis not present

## 2023-02-08 DIAGNOSIS — Z860100 Personal history of colon polyps, unspecified: Secondary | ICD-10-CM | POA: Diagnosis not present

## 2023-02-08 DIAGNOSIS — K5909 Other constipation: Secondary | ICD-10-CM | POA: Diagnosis not present

## 2023-03-12 DIAGNOSIS — H40013 Open angle with borderline findings, low risk, bilateral: Secondary | ICD-10-CM | POA: Diagnosis not present

## 2023-03-12 DIAGNOSIS — H35372 Puckering of macula, left eye: Secondary | ICD-10-CM | POA: Diagnosis not present

## 2023-04-05 ENCOUNTER — Ambulatory Visit: Payer: PPO

## 2023-04-05 DIAGNOSIS — K635 Polyp of colon: Secondary | ICD-10-CM | POA: Diagnosis not present

## 2023-04-05 DIAGNOSIS — Z09 Encounter for follow-up examination after completed treatment for conditions other than malignant neoplasm: Secondary | ICD-10-CM | POA: Diagnosis not present

## 2023-04-05 DIAGNOSIS — D125 Benign neoplasm of sigmoid colon: Secondary | ICD-10-CM | POA: Diagnosis not present

## 2023-04-05 DIAGNOSIS — K640 First degree hemorrhoids: Secondary | ICD-10-CM | POA: Diagnosis not present

## 2023-04-05 DIAGNOSIS — Z860100 Personal history of colon polyps, unspecified: Secondary | ICD-10-CM | POA: Diagnosis not present

## 2023-04-05 DIAGNOSIS — Z1211 Encounter for screening for malignant neoplasm of colon: Secondary | ICD-10-CM | POA: Diagnosis not present

## 2023-04-05 DIAGNOSIS — K5730 Diverticulosis of large intestine without perforation or abscess without bleeding: Secondary | ICD-10-CM | POA: Diagnosis not present

## 2023-04-20 DIAGNOSIS — H35373 Puckering of macula, bilateral: Secondary | ICD-10-CM | POA: Diagnosis not present

## 2023-04-20 DIAGNOSIS — H26493 Other secondary cataract, bilateral: Secondary | ICD-10-CM | POA: Diagnosis not present

## 2023-04-20 DIAGNOSIS — H40013 Open angle with borderline findings, low risk, bilateral: Secondary | ICD-10-CM | POA: Diagnosis not present

## 2023-04-20 DIAGNOSIS — H18513 Endothelial corneal dystrophy, bilateral: Secondary | ICD-10-CM | POA: Diagnosis not present

## 2023-05-04 DIAGNOSIS — H26492 Other secondary cataract, left eye: Secondary | ICD-10-CM | POA: Diagnosis not present

## 2023-06-03 DIAGNOSIS — H26493 Other secondary cataract, bilateral: Secondary | ICD-10-CM | POA: Diagnosis not present

## 2023-06-03 DIAGNOSIS — H40013 Open angle with borderline findings, low risk, bilateral: Secondary | ICD-10-CM | POA: Diagnosis not present

## 2023-06-03 DIAGNOSIS — H35372 Puckering of macula, left eye: Secondary | ICD-10-CM | POA: Diagnosis not present

## 2023-07-20 DIAGNOSIS — R052 Subacute cough: Secondary | ICD-10-CM | POA: Diagnosis not present

## 2023-08-31 DIAGNOSIS — H40013 Open angle with borderline findings, low risk, bilateral: Secondary | ICD-10-CM | POA: Diagnosis not present

## 2023-08-31 DIAGNOSIS — H26493 Other secondary cataract, bilateral: Secondary | ICD-10-CM | POA: Diagnosis not present

## 2023-08-31 DIAGNOSIS — H35372 Puckering of macula, left eye: Secondary | ICD-10-CM | POA: Diagnosis not present

## 2023-09-13 DIAGNOSIS — N3001 Acute cystitis with hematuria: Secondary | ICD-10-CM | POA: Diagnosis not present

## 2023-10-18 ENCOUNTER — Other Ambulatory Visit: Payer: Self-pay | Admitting: Obstetrics and Gynecology

## 2023-10-18 DIAGNOSIS — Z01411 Encounter for gynecological examination (general) (routine) with abnormal findings: Secondary | ICD-10-CM | POA: Diagnosis not present

## 2023-10-18 DIAGNOSIS — Z1331 Encounter for screening for depression: Secondary | ICD-10-CM | POA: Diagnosis not present

## 2023-10-18 DIAGNOSIS — Z1272 Encounter for screening for malignant neoplasm of vagina: Secondary | ICD-10-CM | POA: Diagnosis not present

## 2023-10-18 DIAGNOSIS — Z9189 Other specified personal risk factors, not elsewhere classified: Secondary | ICD-10-CM | POA: Diagnosis not present

## 2023-10-18 DIAGNOSIS — Z1231 Encounter for screening mammogram for malignant neoplasm of breast: Secondary | ICD-10-CM

## 2023-10-27 DIAGNOSIS — R509 Fever, unspecified: Secondary | ICD-10-CM | POA: Diagnosis not present

## 2023-10-27 DIAGNOSIS — W57XXXA Bitten or stung by nonvenomous insect and other nonvenomous arthropods, initial encounter: Secondary | ICD-10-CM | POA: Diagnosis not present

## 2023-10-27 DIAGNOSIS — R5383 Other fatigue: Secondary | ICD-10-CM | POA: Diagnosis not present

## 2023-10-27 DIAGNOSIS — S30860A Insect bite (nonvenomous) of lower back and pelvis, initial encounter: Secondary | ICD-10-CM | POA: Diagnosis not present

## 2023-10-27 DIAGNOSIS — Z8619 Personal history of other infectious and parasitic diseases: Secondary | ICD-10-CM | POA: Diagnosis not present

## 2023-10-27 DIAGNOSIS — R5381 Other malaise: Secondary | ICD-10-CM | POA: Diagnosis not present

## 2023-11-08 DIAGNOSIS — L821 Other seborrheic keratosis: Secondary | ICD-10-CM | POA: Diagnosis not present

## 2023-11-08 DIAGNOSIS — L570 Actinic keratosis: Secondary | ICD-10-CM | POA: Diagnosis not present

## 2023-12-14 DIAGNOSIS — L820 Inflamed seborrheic keratosis: Secondary | ICD-10-CM | POA: Diagnosis not present

## 2023-12-14 DIAGNOSIS — D485 Neoplasm of uncertain behavior of skin: Secondary | ICD-10-CM | POA: Diagnosis not present

## 2023-12-24 ENCOUNTER — Ambulatory Visit
Admission: RE | Admit: 2023-12-24 | Discharge: 2023-12-24 | Disposition: A | Discharge status: Home / Self Care | Source: Ambulatory Visit | Attending: Obstetrics and Gynecology | Admitting: Obstetrics and Gynecology

## 2023-12-24 DIAGNOSIS — Z1231 Encounter for screening mammogram for malignant neoplasm of breast: Secondary | ICD-10-CM | POA: Insufficient documentation

## 2024-01-20 DIAGNOSIS — Z79899 Other long term (current) drug therapy: Secondary | ICD-10-CM | POA: Diagnosis not present

## 2024-01-20 DIAGNOSIS — E7800 Pure hypercholesterolemia, unspecified: Secondary | ICD-10-CM | POA: Diagnosis not present

## 2024-01-24 DIAGNOSIS — Z85828 Personal history of other malignant neoplasm of skin: Secondary | ICD-10-CM | POA: Diagnosis not present

## 2024-01-24 DIAGNOSIS — Z86018 Personal history of other benign neoplasm: Secondary | ICD-10-CM | POA: Diagnosis not present

## 2024-01-24 DIAGNOSIS — Z872 Personal history of diseases of the skin and subcutaneous tissue: Secondary | ICD-10-CM | POA: Diagnosis not present

## 2024-01-24 DIAGNOSIS — L578 Other skin changes due to chronic exposure to nonionizing radiation: Secondary | ICD-10-CM | POA: Diagnosis not present

## 2024-02-01 DIAGNOSIS — E7800 Pure hypercholesterolemia, unspecified: Secondary | ICD-10-CM | POA: Diagnosis not present

## 2024-02-01 DIAGNOSIS — Z79899 Other long term (current) drug therapy: Secondary | ICD-10-CM | POA: Diagnosis not present

## 2024-02-01 DIAGNOSIS — Z0000 Encounter for general adult medical examination without abnormal findings: Secondary | ICD-10-CM | POA: Diagnosis not present

## 2024-02-01 DIAGNOSIS — Z1331 Encounter for screening for depression: Secondary | ICD-10-CM | POA: Diagnosis not present

## 2024-02-01 DIAGNOSIS — I10 Essential (primary) hypertension: Secondary | ICD-10-CM | POA: Diagnosis not present
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
## 2024-04-06 ENCOUNTER — Ambulatory Visit: Admitting: Urology

## 2024-04-06 VITALS — BP 152/80 | HR 98 | Wt 163.0 lb

## 2024-04-06 DIAGNOSIS — R102 Pelvic and perineal pain unspecified side: Secondary | ICD-10-CM

## 2024-04-06 DIAGNOSIS — N390 Urinary tract infection, site not specified: Secondary | ICD-10-CM | POA: Diagnosis not present

## 2024-04-06 LAB — URINALYSIS, COMPLETE
Bilirubin, UA: NEGATIVE
Glucose, UA: NEGATIVE
Ketones, UA: NEGATIVE
Leukocytes,UA: NEGATIVE
Nitrite, UA: NEGATIVE
Protein,UA: NEGATIVE
RBC, UA: NEGATIVE
Specific Gravity, UA: 1.02 (ref 1.005–1.030)
Urobilinogen, Ur: 0.2 mg/dL (ref 0.2–1.0)
pH, UA: 5.5 (ref 5.0–7.5)

## 2024-04-06 LAB — MICROSCOPIC EXAMINATION: Epithelial Cells (non renal): >10 /HPF — AB (ref 0–10)

## 2024-04-06 LAB — BLADDER SCAN AMB NON-IMAGING

## 2024-04-06 NOTE — Patient Instructions (Addendum)
 Please contact central scheduling at [phone] to schedule your renal ultrasound

## 2024-04-09 ENCOUNTER — Encounter: Payer: Self-pay | Admitting: Urology

## 2024-04-13 ENCOUNTER — Ambulatory Visit
Admission: RE | Admit: 2024-04-13 | Discharge: 2024-04-13 | Disposition: A | Discharge status: Home / Self Care | Source: Ambulatory Visit | Attending: Urology | Admitting: Urology

## 2024-04-13 ENCOUNTER — Ambulatory Visit: Payer: Self-pay | Admitting: Urology

## 2024-04-13 DIAGNOSIS — N390 Urinary tract infection, site not specified: Secondary | ICD-10-CM | POA: Insufficient documentation

## 2024-04-22 NOTE — Telephone Encounter (Signed)
 She hasn't read her MyChart message, so will you call her and tell her message.   Mrs. Welp, Your kidney ultrasound was normal.  This is great news.  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  This is where the kidney has two drainage systems rather than just one.  This is a normal variant and it isn't dangerous or cause damage to your kidneys.  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  Please let me know if you have any further questions or concerns.   Alydia Gosser, PA-C

## 2024-04-25 NOTE — Telephone Encounter (Signed)
-----   Message from Clotilda DELENA Cornwall, PA-C sent at 04/22/2024  3:59 PM EST -----

## 2024-04-25 NOTE — Telephone Encounter (Signed)
 Spoke with patient in regards to renal ultrasound results. Advised that kidney ultrasound was normal. Patient verbalized understanding of results given.  Andrea Kirks LPN
